# Patient Record
Sex: Male | Born: 1970 | Race: Black or African American | Hispanic: No | Marital: Single | Smoking: Current every day smoker
Health system: Southern US, Community
[De-identification: ages and names within clinical notes are randomized; demographics above are authoritative.]

## PROBLEM LIST (undated history)

## (undated) DIAGNOSIS — B20 Human immunodeficiency virus [HIV] disease: Secondary | ICD-10-CM

---

## 2007-02-23 ENCOUNTER — Emergency Department (HOSPITAL_COMMUNITY): Admission: EM | Admit: 2007-02-23 | Discharge: 2007-02-23 | Payer: Self-pay | Admitting: Emergency Medicine

## 2008-04-07 DIAGNOSIS — B20 Human immunodeficiency virus [HIV] disease: Secondary | ICD-10-CM

## 2008-04-07 DIAGNOSIS — Z21 Asymptomatic human immunodeficiency virus [HIV] infection status: Secondary | ICD-10-CM

## 2008-04-07 HISTORY — DX: Asymptomatic human immunodeficiency virus (hiv) infection status: Z21

## 2008-04-07 HISTORY — DX: Human immunodeficiency virus (HIV) disease: B20
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
## 2010-11-01 ENCOUNTER — Emergency Department (HOSPITAL_COMMUNITY)
Admission: EM | Admit: 2010-11-01 | Discharge: 2010-11-01 | Disposition: A | Discharge status: Home / Self Care | Payer: Managed Care, Other (non HMO) | Attending: Emergency Medicine | Admitting: Emergency Medicine

## 2010-11-01 DIAGNOSIS — R112 Nausea with vomiting, unspecified: Secondary | ICD-10-CM | POA: Insufficient documentation

## 2010-11-01 DIAGNOSIS — R109 Unspecified abdominal pain: Secondary | ICD-10-CM | POA: Insufficient documentation

## 2010-11-01 DIAGNOSIS — R079 Chest pain, unspecified: Secondary | ICD-10-CM | POA: Insufficient documentation

## 2010-11-01 DIAGNOSIS — F17200 Nicotine dependence, unspecified, uncomplicated: Secondary | ICD-10-CM | POA: Insufficient documentation

## 2010-11-01 DIAGNOSIS — R197 Diarrhea, unspecified: Secondary | ICD-10-CM | POA: Insufficient documentation

## 2010-11-01 LAB — CBC
HCT: 52.1 % — ABNORMAL HIGH (ref 39.0–52.0)
Hemoglobin: 18.9 g/dL — ABNORMAL HIGH (ref 13.0–17.0)
MCV: 84.4 fL (ref 78.0–100.0)
RBC: 6.17 MIL/uL — ABNORMAL HIGH (ref 4.22–5.81)
RDW: 12.4 % (ref 11.5–15.5)
WBC: 10.8 10*3/uL — ABNORMAL HIGH (ref 4.0–10.5)

## 2010-11-01 LAB — DIFFERENTIAL
Basophils Absolute: 0.1 10*3/uL (ref 0.0–0.1)
Eosinophils Absolute: 0.2 10*3/uL (ref 0.0–0.7)
Neutro Abs: 5.9 10*3/uL (ref 1.7–7.7)
Neutrophils Relative %: 54 % (ref 43–77)
Smear Review: ADEQUATE

## 2010-11-01 LAB — COMPREHENSIVE METABOLIC PANEL
AST: 28 U/L (ref 0–37)
BUN: 22 mg/dL (ref 6–23)
CO2: 20 mEq/L (ref 19–32)
Creatinine, Ser: 1.34 mg/dL (ref 0.50–1.35)
GFR calc non Af Amer: 60 mL/min — ABNORMAL LOW (ref >60)
Glucose, Bld: 103 mg/dL — ABNORMAL HIGH (ref 70–99)
Total Bilirubin: 0.4 mg/dL (ref 0.3–1.2)
Total Protein: 8.3 g/dL (ref 6.0–8.3)

## 2010-11-01 LAB — URINALYSIS, ROUTINE W REFLEX MICROSCOPIC
Hgb urine dipstick: NEGATIVE
Ketones, ur: 15 mg/dL — AB
Leukocytes, UA: NEGATIVE
Nitrite: NEGATIVE
Specific Gravity, Urine: 1.027 (ref 1.005–1.030)

## 2010-11-01 LAB — URINE MICROSCOPIC-ADD ON

## 2010-12-17 ENCOUNTER — Emergency Department (HOSPITAL_COMMUNITY)
Admission: EM | Admit: 2010-12-17 | Discharge: 2010-12-17 | Disposition: A | Discharge status: Home / Self Care | Payer: Managed Care, Other (non HMO) | Attending: Emergency Medicine | Admitting: Emergency Medicine

## 2010-12-17 DIAGNOSIS — R221 Localized swelling, mass and lump, neck: Secondary | ICD-10-CM | POA: Insufficient documentation

## 2010-12-17 DIAGNOSIS — K089 Disorder of teeth and supporting structures, unspecified: Secondary | ICD-10-CM | POA: Insufficient documentation

## 2010-12-17 DIAGNOSIS — R220 Localized swelling, mass and lump, head: Secondary | ICD-10-CM | POA: Insufficient documentation

## 2015-06-26 ENCOUNTER — Emergency Department (HOSPITAL_COMMUNITY)
Admission: EM | Admit: 2015-06-26 | Discharge: 2015-06-27 | Disposition: A | Discharge status: Home / Self Care | Payer: Managed Care, Other (non HMO) | Attending: Emergency Medicine | Admitting: Emergency Medicine

## 2015-06-26 ENCOUNTER — Encounter (HOSPITAL_COMMUNITY): Payer: Self-pay | Admitting: Emergency Medicine

## 2015-06-26 ENCOUNTER — Emergency Department (HOSPITAL_COMMUNITY): Payer: Managed Care, Other (non HMO)

## 2015-06-26 DIAGNOSIS — R1310 Dysphagia, unspecified: Secondary | ICD-10-CM | POA: Diagnosis not present

## 2015-06-26 DIAGNOSIS — J029 Acute pharyngitis, unspecified: Secondary | ICD-10-CM | POA: Diagnosis not present

## 2015-06-26 DIAGNOSIS — F17210 Nicotine dependence, cigarettes, uncomplicated: Secondary | ICD-10-CM | POA: Insufficient documentation

## 2015-06-26 DIAGNOSIS — R509 Fever, unspecified: Secondary | ICD-10-CM | POA: Diagnosis not present

## 2015-06-26 DIAGNOSIS — B20 Human immunodeficiency virus [HIV] disease: Secondary | ICD-10-CM | POA: Insufficient documentation

## 2015-06-26 DIAGNOSIS — E876 Hypokalemia: Secondary | ICD-10-CM | POA: Diagnosis not present

## 2015-06-26 DIAGNOSIS — R0981 Nasal congestion: Secondary | ICD-10-CM | POA: Diagnosis not present

## 2015-06-26 DIAGNOSIS — R630 Anorexia: Secondary | ICD-10-CM | POA: Diagnosis not present

## 2015-06-26 DIAGNOSIS — R05 Cough: Secondary | ICD-10-CM | POA: Insufficient documentation

## 2015-06-26 DIAGNOSIS — R0602 Shortness of breath: Secondary | ICD-10-CM | POA: Diagnosis present

## 2015-06-26 DIAGNOSIS — B370 Candidal stomatitis: Secondary | ICD-10-CM

## 2015-06-26 DIAGNOSIS — B379 Candidiasis, unspecified: Secondary | ICD-10-CM | POA: Insufficient documentation

## 2015-06-26 DIAGNOSIS — Z79899 Other long term (current) drug therapy: Secondary | ICD-10-CM | POA: Diagnosis not present

## 2015-06-26 HISTORY — DX: Human immunodeficiency virus (HIV) disease: B20

## 2015-06-26 MED ORDER — SODIUM CHLORIDE 0.9 % IV BOLUS (SEPSIS)
1000.0000 mL | Freq: Once | INTRAVENOUS | Status: AC
Start: 2015-06-26 — End: 2015-06-27
  Administered 2015-06-26: 1000 mL via INTRAVENOUS

## 2015-06-26 MED ORDER — ALBUTEROL SULFATE (2.5 MG/3ML) 0.083% IN NEBU
5.0000 mg | INHALATION_SOLUTION | Freq: Once | RESPIRATORY_TRACT | Status: AC
  Administered 2015-06-26: 5 mg via RESPIRATORY_TRACT
  Filled 2015-06-26: qty 6

## 2015-06-26 NOTE — ED Provider Notes (Signed)
CSN: [id number]     Arrival date & time 06/26/15  1636 History   First MD Initiated Contact with Patient 06/26/15 2225     Chief Complaint  Patient presents with  . Shortness of Breath  . Gastroesophageal Reflux     (Consider location/radiation/quality/duration/timing/severity/associated sxs/prior Treatment) Patient is a 45 y.o. male presenting with shortness of breath and GERD. The history is provided by the patient and medical records. No language interpreter was used.  Shortness of Breath Associated symptoms: cough, fever and sore throat   Associated symptoms: no abdominal pain, no chest pain, no headaches, no rash and no vomiting   Gastroesophageal Reflux Associated symptoms include congestion, coughing, a fever and a sore throat. Pertinent negatives include no abdominal pain, chest pain, chills, headaches, rash or vomiting.  Lucas Wood is a 45 y.o. male  with a PMH of HIV who presents to the Emergency Department complaining of worsening shortness of breath, productive cough, and "throat pain" x 2 weeks. Patient admits to difficulty swallowing and decreased PO intake 2/2 throat pain. Subjective fever at home. No medications taken PTA. No alleviating factors noted. Patient states he was last seen for his HIV 3 years ago- not currently on medication and not followed by primary physician.   Past Medical History  Diagnosis Date  . HIV (human immunodeficiency virus infection) (HCC)    History reviewed. No pertinent past surgical history. No family history on file. Social History  Substance Use Topics  . Smoking status: Current Every Day Smoker -- 1.00 packs/day    Types: Cigarettes  . Smokeless tobacco: None  . Alcohol Use: Yes    Review of Systems  Constitutional: Positive for fever and appetite change. Negative for chills.  HENT: Positive for congestion, sore throat and trouble swallowing.   Eyes: Negative for visual disturbance.  Respiratory: Positive for cough and  shortness of breath.   Cardiovascular: Negative for chest pain.  Gastrointestinal: Negative for vomiting, abdominal pain and diarrhea.  Genitourinary: Negative for dysuria.  Musculoskeletal: Negative for back pain.  Skin: Negative for rash.  Neurological: Negative for dizziness and headaches.      Allergies  Review of patient's allergies indicates no known allergies.  Home Medications   Prior to Admission medications   Medication Sig Start Date End Date Taking? Authorizing Provider  Alum Hydroxide-Mag Carbonate (GAVISCON EXTRA RELIEF FORMULA PO) Take 2-4 tablets by mouth every 6 (six) hours as needed (acid reflux).   Yes Historical Provider, MD  ibuprofen (ADVIL,MOTRIN) 200 MG tablet Take 800 mg by mouth every 6 (six) hours as needed for moderate pain.   Yes Historical Provider, MD  Pseudoephedrine-APAP-DM (DAYQUIL PO) Take 30 mLs by mouth daily as needed (cold).   Yes Historical Provider, MD  nystatin (MYCOSTATIN) 100000 UNIT/ML suspension Take 5 mLs (500,000 Units total) by mouth 4 (four) times daily. Swish in mouth several minutes and then swallow 06/27/15   [facility] LP[facility]ime Pilcher Ward, PA-C   BP 112/86 mmHg  Pulse 86  Temp(Src) 99 F (37.2 C) (Oral)  Resp 18  Ht 5\' 11"  (1.803 m)  Wt 58.514 kg  BMI 18.00 kg/m2  SpO2 99% Physical Exam  Constitutional: He is oriented to person, place, and time. He appears well-developed and well-nourished.  Alert and in no acute distress  HENT:  Head: Normocephalic and atraumatic.  Airway patent. Thrush to OP/hard palate. Tacky mucus membranes.   Cardiovascular: Normal rate, regular rhythm and normal heart sounds.  Exam reveals no gallop and no friction rub.  No murmur heard. Pulmonary/Chest: Effort normal and breath sounds normal. No respiratory distress. He has no wheezes. He has no rales. He exhibits no tenderness.  Abdominal: Soft. Bowel sounds are normal. He exhibits no distension and no mass. There is no tenderness. There is no rebound and no  guarding.  Musculoskeletal: He exhibits no edema.  Neurological: He is alert and oriented to person, place, and time.  Skin: Skin is warm and dry.  Cap refill less than 3 seconds.   Psychiatric: He has a normal mood and affect. His behavior is normal. Judgment and thought content normal.  Nursing note and vitals reviewed.   ED Course  Procedures (including critical care time) Labs Review Labs Reviewed  CBC WITH DIFFERENTIAL/PLATELET - Abnormal; Notable for the following:    WBC 3.9 (*)    RBC 3.52 (*)    Hemoglobin 10.8 (*)    HCT 31.3 (*)    Platelets 44 (*)    All other components within normal limits  BASIC METABOLIC PANEL - Abnormal; Notable for the following:    Potassium 2.9 (*)    Glucose, Bld 101 (*)    Calcium 8.0 (*)    All other components within normal limits  T-HELPER CELLS (CD4) COUNT (NOT AT [facility])  HIV 1 RNA QUANT-NO REFLEX-BLD    Imaging Review Dg Chest 2 View  06/26/2015  CLINICAL DATA:  Cough and shortness of breath for 2 days, patient smokes EXAM: CHEST  2 VIEW COMPARISON:  None. FINDINGS: The heart size and mediastinal contours are within normal limits. Both lungs are clear. The visualized skeletal structures are unremarkable. IMPRESSION: No active cardiopulmonary disease. Electronically Signed   By: Esperanza Heir M.D.   On: 06/26/2015 18:17   I have personally reviewed and evaluated these images and lab results as part of my medical decision-making.   EKG Interpretation   Date/Time:  Tuesday June 26 2015 17:19:41 EDT Ventricular Rate:  103 PR Interval:  149 QRS Duration: 103 QT Interval:  353 QTC Calculation: 462 R Axis:   89 Text Interpretation:  Sinus tachycardia Anteroseptal infarct, age  indeterminate Confirmed by Juleen China  MD, STEPHEN ([id number]) on 06/27/2015 1:32:42  AM      MDM   Final diagnoses:  Thrush  Hypokalemia   Lucas Wood is a 45 y.o. male with PMH of HIV who presents with cough/congestion/sore throat x 2 weeks. Patient  with thrush on exam. Patient has not been seen by physician in 3 years and not currently on any HIV medications.   Labs: CBC, BMP reviewed. K+ of  2.9 - replenished in ED. CD4 and viral load ordered to aide in follow up care.  Imaging: CXR with no acute cardiopulmonary disease Therapeutics: 1L IV fluids  Plan: Tx thrush with nystatin. Referral to ID and [facility]. Significant time was taken to stress the importance of follow up appointments. Home care instructions discussed. Return precautions discussed. All questions answered.   Patient seen by and discussed with Dr. Juleen China who agrees with treatment plan.    [facility] Ward, PA-C 06/27/15 0133  Raeford Razor, MD 07/03/15 1000

## 2015-06-26 NOTE — ED Provider Notes (Signed)
Medical screening examination/treatment/procedure(s) were conducted as a shared visit with non-physician practitioner(s) and myself.  I personally evaluated the patient during the encounter.   EKG Interpretation None     44yM with sore throat. Has thrush. Hx of HIV not on treatment. Reports that previous seen at [facility] but not in a couple years because of transportation difficulties. C/o cough and dyspnea but no distress on exam. No increased WOB. Lungs clear. CXR w/o acute abnormality. Afebrile. Nystatin for thrush. More pressing concern is that he gets established with ID. Will give contact information to him as well as [facility].   Raeford RazorStephen Rosalynd Mcwright, MD 06/28/15 [phone]

## 2015-06-26 NOTE — ED Notes (Signed)
Patient states he has been short of breath, productive cough (yellow mucus), acid reflux have also gotten his "esophagus raw" x2 weeks. Patient reports nausea without vomiting.

## 2015-06-27 LAB — CBC WITH DIFFERENTIAL/PLATELET
Basophils Absolute: 0 10*3/uL (ref 0.0–0.1)
Basophils Relative: 0 %
EOS ABS: 0 10*3/uL (ref 0.0–0.7)
EOS PCT: 0 %
HCT: 31.3 % — ABNORMAL LOW (ref 39.0–52.0)
Hemoglobin: 10.8 g/dL — ABNORMAL LOW (ref 13.0–17.0)
LYMPHS ABS: 1.1 10*3/uL (ref 0.7–4.0)
Lymphocytes Relative: 27 %
MCH: 30.7 pg (ref 26.0–34.0)
MCHC: 34.5 g/dL (ref 30.0–36.0)
MCV: 88.9 fL (ref 78.0–100.0)
MONO ABS: 0.5 10*3/uL (ref 0.1–1.0)
Monocytes Relative: 14 %
NEUTROS PCT: 59 %
Neutro Abs: 2.3 10*3/uL (ref 1.7–7.7)
PLATELETS: 44 10*3/uL — AB (ref 150–400)
RBC: 3.52 MIL/uL — AB (ref 4.22–5.81)
RDW: 12.5 % (ref 11.5–15.5)
WBC: 3.9 10*3/uL — AB (ref 4.0–10.5)

## 2015-06-27 LAB — BASIC METABOLIC PANEL
ANION GAP: 9 (ref 5–15)
BUN: 12 mg/dL (ref 6–20)
CALCIUM: 8 mg/dL — AB (ref 8.9–10.3)
CO2: 26 mmol/L (ref 22–32)
Chloride: 105 mmol/L (ref 101–111)
Creatinine, Ser: 0.79 mg/dL (ref 0.61–1.24)
GFR calc Af Amer: >60 mL/min (ref >60)
GLUCOSE: 101 mg/dL — AB (ref 65–99)
Potassium: 2.9 mmol/L — ABNORMAL LOW (ref 3.5–5.1)
SODIUM: 140 mmol/L (ref 135–145)

## 2015-06-27 LAB — T-HELPER CELLS (CD4) COUNT (NOT AT ARMC)
CD4 T CELL ABS: 110 /uL — AB (ref 400–2700)
CD4 T CELL HELPER: 10 % — AB (ref 33–55)

## 2015-06-27 MED ORDER — POTASSIUM CHLORIDE CRYS ER 20 MEQ PO TBCR
60.0000 meq | EXTENDED_RELEASE_TABLET | Freq: Once | ORAL | Status: AC
  Administered 2015-06-27: 60 meq via ORAL
  Filled 2015-06-27: qty 3

## 2015-06-27 MED ORDER — NYSTATIN 100000 UNIT/ML MT SUSP: 500000.0000 [IU] | Freq: Four times a day (QID) | OROMUCOSAL | Status: DC

## 2015-06-27 MED ORDER — POTASSIUM CHLORIDE CRYS ER 20 MEQ PO TBCR
40.0000 meq | EXTENDED_RELEASE_TABLET | Freq: Once | ORAL | Status: DC
  Filled 2015-06-27: qty 2

## 2015-06-27 NOTE — Discharge Instructions (Signed)
1. Medications: Use nystatin as directed, continue usual home medications 2. Treatment: rest, drink plenty of fluids 3. Follow Up: Please follow up with the infectious disease clinic listed above as well as the [facility] for discussion of your diagnoses and further evaluation after today's visit; Please return to the ER for new or worsening symptoms, any additional concerns.

## 2015-06-28 LAB — HIV-1 RNA QUANT-NO REFLEX-BLD
HIV 1 RNA QUANT: 3870000 {copies}/mL
LOG10 HIV-1 RNA: 6.588 log10copy/mL

## 2015-07-16 ENCOUNTER — Encounter (HOSPITAL_COMMUNITY): Payer: Self-pay | Admitting: *Deleted

## 2015-07-16 ENCOUNTER — Emergency Department (HOSPITAL_COMMUNITY)
Admission: EM | Admit: 2015-07-16 | Discharge: 2015-07-17 | Disposition: A | Discharge status: Home / Self Care | Payer: Managed Care, Other (non HMO) | Attending: Emergency Medicine | Admitting: Emergency Medicine

## 2015-07-16 ENCOUNTER — Emergency Department (HOSPITAL_COMMUNITY): Payer: Managed Care, Other (non HMO)

## 2015-07-16 DIAGNOSIS — J029 Acute pharyngitis, unspecified: Secondary | ICD-10-CM | POA: Diagnosis not present

## 2015-07-16 DIAGNOSIS — B20 Human immunodeficiency virus [HIV] disease: Secondary | ICD-10-CM | POA: Diagnosis not present

## 2015-07-16 DIAGNOSIS — R05 Cough: Secondary | ICD-10-CM | POA: Insufficient documentation

## 2015-07-16 DIAGNOSIS — Z79899 Other long term (current) drug therapy: Secondary | ICD-10-CM | POA: Diagnosis not present

## 2015-07-16 DIAGNOSIS — R000 Tachycardia, unspecified: Secondary | ICD-10-CM | POA: Insufficient documentation

## 2015-07-16 DIAGNOSIS — R059 Cough, unspecified: Secondary | ICD-10-CM

## 2015-07-16 DIAGNOSIS — F17210 Nicotine dependence, cigarettes, uncomplicated: Secondary | ICD-10-CM | POA: Diagnosis not present

## 2015-07-16 DIAGNOSIS — R0602 Shortness of breath: Secondary | ICD-10-CM | POA: Diagnosis not present

## 2015-07-16 DIAGNOSIS — B370 Candidal stomatitis: Secondary | ICD-10-CM | POA: Diagnosis not present

## 2015-07-16 DIAGNOSIS — Z21 Asymptomatic human immunodeficiency virus [HIV] infection status: Secondary | ICD-10-CM

## 2015-07-16 LAB — CBC WITH DIFFERENTIAL/PLATELET
BASOS PCT: 0 %
Basophils Absolute: 0 10*3/uL (ref 0.0–0.1)
EOS ABS: 0 10*3/uL (ref 0.0–0.7)
EOS PCT: 0 %
HCT: 29.4 % — ABNORMAL LOW (ref 39.0–52.0)
Hemoglobin: 10 g/dL — ABNORMAL LOW (ref 13.0–17.0)
Lymphocytes Relative: 29 %
Lymphs Abs: 0.8 10*3/uL (ref 0.7–4.0)
MCH: 30.3 pg (ref 26.0–34.0)
MCHC: 34 g/dL (ref 30.0–36.0)
MCV: 89.1 fL (ref 78.0–100.0)
MONOS PCT: 17 %
Monocytes Absolute: 0.5 10*3/uL (ref 0.1–1.0)
Neutro Abs: 1.5 10*3/uL — ABNORMAL LOW (ref 1.7–7.7)
Neutrophils Relative %: 55 %
PLATELETS: 70 10*3/uL — AB (ref 150–400)
RBC: 3.3 MIL/uL — ABNORMAL LOW (ref 4.22–5.81)
RDW: 12.4 % (ref 11.5–15.5)
WBC: 2.8 10*3/uL — ABNORMAL LOW (ref 4.0–10.5)

## 2015-07-16 LAB — BASIC METABOLIC PANEL
Anion gap: 12 (ref 5–15)
CO2: 28 mmol/L (ref 22–32)
Calcium: 8.6 mg/dL — ABNORMAL LOW (ref 8.9–10.3)
Chloride: 99 mmol/L — ABNORMAL LOW (ref 101–111)
Creatinine, Ser: 0.62 mg/dL (ref 0.61–1.24)
GFR calc Af Amer: >60 mL/min (ref >60)
GFR calc non Af Amer: >60 mL/min (ref >60)
Glucose, Bld: 90 mg/dL (ref 65–99)
Potassium: 3.2 mmol/L — ABNORMAL LOW (ref 3.5–5.1)
SODIUM: 139 mmol/L (ref 135–145)

## 2015-07-16 LAB — I-STAT CG4 LACTIC ACID, ED
LACTIC ACID, VENOUS: 0.96 mmol/L (ref 0.5–2.0)
Lactic Acid, Venous: 0.65 mmol/L (ref 0.5–2.0)

## 2015-07-16 MED ORDER — IPRATROPIUM-ALBUTEROL 0.5-2.5 (3) MG/3ML IN SOLN
3.0000 mL | Freq: Once | RESPIRATORY_TRACT | Status: AC
  Administered 2015-07-16: 3 mL via RESPIRATORY_TRACT
  Filled 2015-07-16: qty 3

## 2015-07-16 MED ORDER — AZITHROMYCIN 250 MG PO TABS: 250.0000 mg | ORAL_TABLET | Freq: Every day | ORAL | Status: DC

## 2015-07-16 MED ORDER — EMTRICITABINE-TENOFOVIR DF 200-300 MG PO TABS: 1.0000 | ORAL_TABLET | Freq: Every day | ORAL | Status: DC

## 2015-07-16 MED ORDER — ALBUTEROL SULFATE HFA 108 (90 BASE) MCG/ACT IN AERS
2.0000 | INHALATION_SPRAY | Freq: Once | RESPIRATORY_TRACT | Status: AC
  Administered 2015-07-17: 2 via RESPIRATORY_TRACT
  Filled 2015-07-16: qty 6.7

## 2015-07-16 MED ORDER — BENZONATATE 100 MG PO CAPS
100.0000 mg | ORAL_CAPSULE | Freq: Three times a day (TID) | ORAL | Status: AC
Start: 2015-07-16 — End: ?

## 2015-07-16 MED ORDER — NYSTATIN 100000 UNIT/ML MT SUSP: 500000.0000 [IU] | Freq: Four times a day (QID) | OROMUCOSAL | Status: AC

## 2015-07-16 MED ORDER — ALBUTEROL SULFATE HFA 108 (90 BASE) MCG/ACT IN AERS: 2.0000 | INHALATION_SPRAY | RESPIRATORY_TRACT | Status: AC | PRN

## 2015-07-16 MED ORDER — AZITHROMYCIN 250 MG PO TABS
500.0000 mg | ORAL_TABLET | Freq: Once | ORAL | Status: AC
  Administered 2015-07-17: 500 mg via ORAL
  Filled 2015-07-16: qty 2

## 2015-07-16 MED ORDER — BENZONATATE 100 MG PO CAPS
100.0000 mg | ORAL_CAPSULE | Freq: Once | ORAL | Status: AC
  Administered 2015-07-16: 100 mg via ORAL
  Filled 2015-07-16: qty 1

## 2015-07-16 NOTE — ED Notes (Signed)
Pt reports a productive cough for several weeks with yellow sputum. Reports cough is causing sob. Denies fever. [facility]

## 2015-07-16 NOTE — ED Provider Notes (Signed)
CSN: [id number]     Arrival date & time 07/16/15  1713 History   First MD Initiated Contact with Patient 07/16/15 2202     Chief Complaint  Patient presents with  . Cough  . Shortness of Breath     (Consider location/radiation/quality/duration/timing/severity/associated sxs/prior Treatment) HPI   Lucas Wood is a 45 y.o. male, with a history of uncontrolled HIV, presenting to the ED with productive cough with yellow or white sputum, shortness of breath, and sore throat for over a month. Pt has tried NyQuil and DayQuil for his symptoms with minimal relief. States he was last seen for his HIV around three years ago. Pt denies taking any antiviral therapy. Patient denies fever/chills, nausea/vomiting, chest pain, or any other complaints.    Past Medical History  Diagnosis Date  . HIV (human immunodeficiency virus infection) (HCC)    History reviewed. No pertinent past surgical history. History reviewed. No pertinent family history. Social History  Substance Use Topics  . Smoking status: Current Every Day Smoker -- 1.00 packs/day    Types: Cigarettes  . Smokeless tobacco: None  . Alcohol Use: Yes    Review of Systems  Constitutional: Negative for fever, chills, diaphoresis and fatigue.  HENT: Positive for sore throat.   Respiratory: Positive for cough and shortness of breath.   Cardiovascular: Negative for chest pain.  Gastrointestinal: Negative for nausea, vomiting and abdominal pain.  Neurological: Negative for dizziness, weakness, light-headedness and headaches.  All other systems reviewed and are negative.     Allergies  Review of patient's allergies indicates no known allergies.  Home Medications   Prior to Admission medications   Medication Sig Start Date End Date Taking? Authorizing Provider  DM-Doxylamine-Acetaminophen (NYQUIL COLD & FLU PO) Take 30 mLs by mouth at bedtime as needed (for cold).   Yes Historical Provider, MD  ibuprofen (ADVIL,MOTRIN) 200 MG  tablet Take 800 mg by mouth every 6 (six) hours as needed for moderate pain.   Yes Historical Provider, MD  Pseudoephedrine-APAP-DM (DAYQUIL PO) Take 30 mLs by mouth daily as needed (cold).   Yes Historical Provider, MD  albuterol (PROVENTIL HFA;VENTOLIN HFA) 108 (90 Base) MCG/ACT inhaler Inhale 2 puffs into the lungs every 4 (four) hours as needed for wheezing or shortness of breath. 07/16/15   Sidnee Gambrill C Kathlyne Loud, PA-C  azithromycin (ZITHROMAX Z-PAK) 250 MG tablet Take 1 tablet (250 mg total) by mouth daily. 07/16/15   Ellin Fitzgibbons C Chauntae Hults, PA-C  benzonatate (TESSALON) 100 MG capsule Take 1 capsule (100 mg total) by mouth every 8 (eight) hours. 07/16/15   Jadan Hinojos C Dewayne Jurek, PA-C  emtricitabine-tenofovir (TRUVADA) 200-300 MG tablet Take 1 tablet by mouth daily. 07/16/15   Yoshiharu Brassell C Kathren Scearce, PA-C  nystatin (MYCOSTATIN) 100000 UNIT/ML suspension Take 5 mLs (500,000 Units total) by mouth 4 (four) times daily. 07/16/15   Lenora Gomes C Elida Harbin, PA-C   BP 106/73 mmHg  Pulse 101  Temp(Src) 98.7 F (37.1 C) (Oral)  Resp 16  SpO2 95% Physical Exam  Constitutional: He appears well-developed and well-nourished. No distress.  HENT:  Head: Normocephalic and atraumatic.  Oral thrush noted to mucous membranes  Eyes: Conjunctivae are normal. Pupils are equal, round, and reactive to light.  Neck: Normal range of motion. Neck supple.  Cardiovascular: Normal rate, regular rhythm, normal heart sounds and intact distal pulses.   Pulmonary/Chest: Effort normal. No respiratory distress. He has wheezes in the right upper field, the right middle field, the right lower field, the left upper field, the left middle field  and the left lower field.  No increased work of breathing. Patient speaks in full sentences without difficulty.  Abdominal: Soft. There is no tenderness. There is no guarding.  Musculoskeletal: He exhibits no edema or tenderness.  Lymphadenopathy:    He has no cervical adenopathy.  Neurological: He is alert.  Skin: Skin is warm and dry. He  is not diaphoretic.  Psychiatric: He has a normal mood and affect. His behavior is normal.  Nursing note and vitals reviewed.   ED Course  Procedures (including critical care time) Labs Review Labs Reviewed  CBC WITH DIFFERENTIAL/PLATELET - Abnormal; Notable for the following:    WBC 2.8 (*)    RBC 3.30 (*)    Hemoglobin 10.0 (*)    HCT 29.4 (*)    Platelets 70 (*)    Neutro Abs 1.5 (*)    All other components within normal limits  BASIC METABOLIC PANEL - Abnormal; Notable for the following:    Potassium 3.2 (*)    Chloride 99 (*)    BUN <5 (*)    Calcium 8.6 (*)    All other components within normal limits  T-HELPER CELLS (CD4) COUNT (NOT AT [facility])  HIV-1 RNA ULTRAQUANT REFLEX TO GENTYP+  I-STAT CG4 LACTIC ACID, ED  I-STAT CG4 LACTIC ACID, ED    Imaging Review Dg Chest 2 View  07/16/2015  CLINICAL DATA:  Cough and congestion for 1 month, shortness of breath, dysphagia and sore throat at times, history HIV, smoking EXAM: CHEST  2 VIEW COMPARISON:  06/26/2015 FINDINGS: Normal heart size, mediastinal contours, and pulmonary vascularity. Mild chronic bronchitic changes. Lungs hyperinflated but otherwise clear. No pleural effusion or pneumothorax. No acute bony abnormalities. IMPRESSION: Chronic bronchitic changes and hyperinflation without acute infiltrate. Electronically Signed   By: Ulyses Southward M.D.   On: 07/16/2015 18:28   I have personally reviewed and evaluated these images and lab results as part of my medical decision-making.   EKG Interpretation None       Medications  azithromycin (ZITHROMAX) tablet 500 mg (not administered)  albuterol (PROVENTIL HFA;VENTOLIN HFA) 108 (90 Base) MCG/ACT inhaler 2 puff (not administered)  ipratropium-albuterol (DUONEB) 0.5-2.5 (3) MG/3ML nebulizer solution 3 mL (3 mLs Nebulization Given 07/16/15 2240)  benzonatate (TESSALON) capsule 100 mg (100 mg Oral Given 07/16/15 2240)    MDM   Final diagnoses:  Cough  Shortness of breath  HIV  (human immunodeficiency virus infection) (HCC)    Lucas Wood presents with cough, shortness of breath, and sore throat for at least 5 weeks.  Findings and plan of care discussed with Lyndal Pulley, MD. Dr. Clydene Pugh personally evaluated and examined this patient.  Patient is nontoxic appearing, afebrile, is only mildly tachycardic, not tachypneic, maintains SPO2 of 97-99% on room air, and is in no apparent distress. Patient has no signs of sepsis. IV fluids were discussed with the patient, but pt declined this treatment. Patient's pulse rate reduced during treatment here in the ED. I suspect that the reason the patient's pulse was above 100 at discharge was due to the effects of the DuoNeb. This patient has been noncompliant with HIV treatment for a number of years. Patient states that he previously has received treatment from infectious disease through [facility], however, there are no records that come up in a query of [facility], [address], [place], or any infectious disease clinic in the state. Patient was educated on HIV, AIDS, and the dangers of refusing to comply with antiviral therapy. Patient  will need to make sure to follow up with the infectious disease clinic as soon as possible. Patient states that he will do this tomorrow morning. Patient cannot remember what antiviral therapy he was taking 3 years ago. Patient to be discharged with Truvada and nystatin oral mouthwash, in addition to the medications for his cough. Case management consult placed to assist patient with getting his medications. Due to the lateness of patient's visit, his case will be placed in the queue to be picked up by a Child psychotherapistsocial worker tomorrow morning.  Upon reassessment, patient states that his shortness of breath has improved. Home care and return precautions discussed. Patient voiced understanding of these instructions, accepts the plan, and is comfortable with discharge.   Filed Vitals:    07/16/15 1721 07/16/15 2004  BP: 95/80 101/68  Pulse: 110 104  Temp: 99.4 F (37.4 C) 98.7 F (37.1 C)  TempSrc: Oral Oral  Resp: 18 16  SpO2: 97% 99%   Filed Vitals:   07/16/15 2345 07/17/15 0015 07/17/15 0030 07/17/15 0045  BP: 103/61 103/65 103/72 106/73  Pulse: 102 102 106 101  Temp:      TempSrc:      Resp:    16  SpO2: 91% 92% 97% 95%      Anselm PancoastShawn C Louella Medaglia, PA-C 07/17/15 0054  Lyndal Pulleyaniel Knott, MD 07/17/15 [phone]

## 2015-07-17 ENCOUNTER — Telehealth: Payer: Self-pay | Admitting: Surgery

## 2015-07-17 NOTE — Discharge Instructions (Signed)
You have been seen today for cough, shortness of breath, and sore throat. 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 It is also quite possible that your HIV is progressing to AIDS. It is imperative that you seek treatment from an infectious disease clinic. A referral to the [facility] is included in this paperwork. Call tomorrow morning to get an appointment. A social worker from [facility] should also call you sometime tomorrow to speak with you about assistance with getting your medications. As for the treatment of your symptoms, drink plenty of fluids and get plenty of rest. You should be drinking at least a liter of water an hour to stay hydrated. Ibuprofen or Tylenol for pain or fever. Tessalon for cough. Plain Mucinex may help relieve congestion. Follow up with PCP as needed. Return to ED should symptoms worsen.  RESOURCE GUIDE  Chronic Pain Problems: Contact [facility]  [phone] Patients need to be referred by their primary care doctor.  Insufficient Money for Medicine: Contact United Way:  call "211" or [facility] [phone].  No Primary Care Doctor: - Call Health Connect  [phone] - can help you locate a primary care doctor that  accepts your insurance, provides certain services, etc. - Physician Referral Service- [phone]  Agencies that provide inexpensive medical care: - [facility]  [phone] - [facility]  [phone] - [facility]  [phone] - Women's Clinic  [phone] - Planned Parenthood  [phone] Haynes Bast- [facility]  [phone]  Medicaid-accepting [facility] Providers: - [facility]- [address], [address]  [phone], Mon-Fri 9am-7pm, Sat 9am-1pm - [facility]- [address], [address] [place]  [phone] - [facility]- [address], [address] [place]  [phone] [facility]- [address]  [phone] - Renaye RakersVeita Bland- [address], [address], [phone]  Only accepts [place] patients after they have their name  applied to their card  Self Pay (no insurance) in [place]: - Sickle Cell Patients: Dr Willey BladeEric Dean, [facility]  [address], [phone] - [facility]- [address]  [phone]       Redge Gainer-     [facility]- [address], [address]       -     [facility]- see information above (Speak to Cit[employer]am H if you do not have insurance)       -  [facility]- [address], [phone]       -  [facility]- [address],  [phone]       -  [facility]- [address] [place] [address], [phone]       -  Dr Julio Sickssei-Bonsu-  [address], [address], [place], [phone]       -  [facility]- [address], [phone]       -  [facility] [place]- [address] [place] [address], [phone], also [address], [phone]       -    [facility]- [address], [phone], 1st & 3rd Saturday   every month, 10am-1pm  1) Find a Doctor and Pay Out of Pocket Although you won't have to find out who is covered by your  insurance plan, it is a good idea to ask around and get recommendations. You will then need to call the office and see if the doctor you have chosen will accept you as a new patient and what types of options they offer for patients who are self-pay. Some doctors offer discounts or will set up payment plans for their patients who do not have insurance, but you will need to ask so you aren't surprised when you get to your appointment.  2) Contact Your Local Health Department Not all health departments have doctors that can see patients for sick visits, but many do, so it is worth a call to see if yours does. If you don't know where your local health department is, you can check in your phone book. The CDC  also has a tool to help you locate your state's health department, and many state websites also have listings of all of their local health departments.  3) Find a Walk-in Clinic If your illness is not likely to be very severe or complicated, you may want to try a walk in clinic. These are popping up all over the country in pharmacies, drugstores, and shopping centers. They're usually staffed by nurse practitioners or physician assistants that have been trained to treat common illnesses and complaints. They're usually fairly quick and inexpensive. However, if you have serious medical issues or chronic medical problems, these are probably not your best option  STD Testing - [facility] Department of [facility] [place], STD Clinic, [address], [place], phone [phone] or [phone].  Monday - Friday, call for an appointment. [facility] Department of [employer], STD Clinic, [address], [place], phone [phone] or [phone].  Monday - Friday, call for an appointment.  Abuse/Neglect: [facility] Child Abuse Hotline 224-358-7336 [facility] Child Abuse Hotline 5180986718 (After Hours)  Emergency Shelter:  [facility] [phone]  Maternity Homes: - [facility] [phone])[phone] - [facility] ([phone]  MRSA Hotline #:   760-724-8347  [facility] Resources  [facility] [place]  United [facility] Dept. [address].                 [address]         [address]  Blondell Reveal Phone:  [phone]                                  Phone:  [phone]                   Phone:  [phone]  [facility], [phone] - [phone]       -     [facility]  in [place], [address]  [phone], [place] [phone] or [phone] (After Hours)   [place]  Substance Abuse Resources: - Alcohol and Drug Services  [phone] - [place] 5644911062 - [facility] [place] [phone] - Residential & Outpatient Substance Abuse Program  [phone])177-2928  Psychological Services: - [place], [place] [address], [place], [place]: [phone] or [phone], PicCapture[employer]uy  Dental Assistance  If unable to pay or uninsured, contact:  [facility] or [facility]. to become qualified for the adult dental clinic.  Patients with Medicaid: [facility] [phone] [address], [place] [address] [phone]  If unable to pay, or uninsured, contact [facility] [phone]) or [place] [phone] in [place], [place] in [facility]) to become qualified for the adult dental clinic   Other [place]- [place], [place], [place], [address], [place], [place], 2nd and 4th Thursday of the month at 6:30am.  10 clients each day by appointment, can sometimes see walk-in patients if someone does not show for an appointment. [facility]- [address] [place], [place], [address], [place], [place], [place], [address], [place] Department- [place] Department- [place] Department- [phone]

## 2015-07-17 NOTE — Telephone Encounter (Signed)
ED CM attempted to contact patient at the number in record, no answer and unable to LVM, CM will make 2nd attempt 4/12.

## 2015-07-18 LAB — T-HELPER CELLS (CD4) COUNT (NOT AT ARMC)
CD4 % Helper T Cell: 9 % — ABNORMAL LOW (ref 33–55)
CD4 T Cell Abs: 50 /uL — ABNORMAL LOW (ref 400–2700)

## 2015-07-27 LAB — HIV-1 RNA ULTRAQUANT REFLEX TO GENTYP+
HIV-1 RNA BY PCR: 2610000 {copies}/mL
HIV-1 RNA Quant, Log: 6.417 log10copy/mL

## 2015-07-27 LAB — REFLEX TO GENOSURE(R) MG: HIV GENOSURE(R) MG PDF: 0

## 2015-08-19 ENCOUNTER — Encounter (HOSPITAL_COMMUNITY): Payer: Self-pay

## 2015-08-19 ENCOUNTER — Emergency Department (HOSPITAL_COMMUNITY): Payer: Managed Care, Other (non HMO)

## 2015-08-19 ENCOUNTER — Emergency Department (HOSPITAL_COMMUNITY)
Admission: EM | Admit: 2015-08-19 | Discharge: 2015-08-19 | Disposition: A | Discharge status: Home / Self Care | Payer: Managed Care, Other (non HMO) | Attending: Emergency Medicine | Admitting: Emergency Medicine

## 2015-08-19 DIAGNOSIS — F17210 Nicotine dependence, cigarettes, uncomplicated: Secondary | ICD-10-CM | POA: Diagnosis not present

## 2015-08-19 DIAGNOSIS — Z7951 Long term (current) use of inhaled steroids: Secondary | ICD-10-CM | POA: Diagnosis not present

## 2015-08-19 DIAGNOSIS — Z791 Long term (current) use of non-steroidal anti-inflammatories (NSAID): Secondary | ICD-10-CM | POA: Insufficient documentation

## 2015-08-19 DIAGNOSIS — K209 Esophagitis, unspecified without bleeding: Secondary | ICD-10-CM

## 2015-08-19 DIAGNOSIS — Z79899 Other long term (current) drug therapy: Secondary | ICD-10-CM | POA: Insufficient documentation

## 2015-08-19 DIAGNOSIS — B20 Human immunodeficiency virus [HIV] disease: Secondary | ICD-10-CM | POA: Diagnosis not present

## 2015-08-19 DIAGNOSIS — Z792 Long term (current) use of antibiotics: Secondary | ICD-10-CM | POA: Insufficient documentation

## 2015-08-19 DIAGNOSIS — R0789 Other chest pain: Secondary | ICD-10-CM | POA: Diagnosis present

## 2015-08-19 LAB — BASIC METABOLIC PANEL
Anion gap: 13 (ref 5–15)
BUN: 14 mg/dL (ref 6–20)
CHLORIDE: 101 mmol/L (ref 101–111)
CO2: 26 mmol/L (ref 22–32)
CREATININE: 0.76 mg/dL (ref 0.61–1.24)
Calcium: 9.2 mg/dL (ref 8.9–10.3)
GFR calc Af Amer: >60 mL/min (ref >60)
GFR calc non Af Amer: >60 mL/min (ref >60)
GLUCOSE: 97 mg/dL (ref 65–99)
Potassium: 2.9 mmol/L — ABNORMAL LOW (ref 3.5–5.1)
SODIUM: 140 mmol/L (ref 135–145)

## 2015-08-19 LAB — CBC
HCT: 32.7 % — ABNORMAL LOW (ref 39.0–52.0)
Hemoglobin: 10.9 g/dL — ABNORMAL LOW (ref 13.0–17.0)
MCH: 29.8 pg (ref 26.0–34.0)
MCHC: 33.3 g/dL (ref 30.0–36.0)
MCV: 89.3 fL (ref 78.0–100.0)
PLATELETS: 96 10*3/uL — AB (ref 150–400)
RBC: 3.66 MIL/uL — ABNORMAL LOW (ref 4.22–5.81)
RDW: 14.7 % (ref 11.5–15.5)
WBC: 2.9 10*3/uL — ABNORMAL LOW (ref 4.0–10.5)

## 2015-08-19 LAB — I-STAT TROPONIN, ED: Troponin i, poc: 0 ng/mL (ref 0.00–0.08)

## 2015-08-19 MED ORDER — SUCRALFATE 1 GM/10ML PO SUSP: 1.0000 g | Freq: Three times a day (TID) | ORAL | Status: AC

## 2015-08-19 MED ORDER — FLUCONAZOLE 100 MG PO TABS: 100.0000 mg | ORAL_TABLET | Freq: Every day | ORAL | Status: DC

## 2015-08-19 MED ORDER — SUCRALFATE 1 GM/10ML PO SUSP
1.0000 g | Freq: Three times a day (TID) | ORAL | Status: DC
  Administered 2015-08-19: 1 g via ORAL
  Filled 2015-08-19 (×5): qty 10

## 2015-08-19 MED ORDER — PANTOPRAZOLE SODIUM 20 MG PO TBEC
20.0000 mg | DELAYED_RELEASE_TABLET | Freq: Every day | ORAL | Status: DC
  Administered 2015-08-19: 20 mg via ORAL
  Filled 2015-08-19 (×2): qty 1

## 2015-08-19 MED ORDER — SODIUM CHLORIDE 0.9 % IV BOLUS (SEPSIS)
1000.0000 mL | Freq: Once | INTRAVENOUS | Status: AC
  Administered 2015-08-19: 1000 mL via INTRAVENOUS

## 2015-08-19 MED ORDER — SODIUM CHLORIDE 0.9 % IV SOLN
INTRAVENOUS | Status: DC
  Administered 2015-08-19: 13:00:00 via INTRAVENOUS

## 2015-08-19 MED ORDER — PANTOPRAZOLE SODIUM 20 MG PO TBEC: 20.0000 mg | DELAYED_RELEASE_TABLET | Freq: Every day | ORAL | Status: AC

## 2015-08-19 MED ORDER — FLUCONAZOLE 100 MG PO TABS
100.0000 mg | ORAL_TABLET | Freq: Once | ORAL | Status: AC
  Administered 2015-08-19: 100 mg via ORAL
  Filled 2015-08-19 (×2): qty 1

## 2015-08-19 NOTE — ED Notes (Signed)
Patient reports intermittent chest pain and SOB x 3 days ago. Patient states he has intermittent dizziness and the last being 2 days ago which caused him to fall.

## 2015-08-19 NOTE — ED Notes (Signed)
Patient too dizzy to complete standing part of orthostatic vital signs.

## 2015-08-19 NOTE — ED Notes (Signed)
Per CM Helmut Musterlicia, pt has insurance, and was advised to call his insurance to help him find a PCP within his network.

## 2015-08-19 NOTE — Care Management Note (Addendum)
Case Management Note  Patient Details  Name: Lucas Wood MRN: [id number] Date of Birth: Dec 18, 1970  Subjective/Objective:     HIV, unable to swallow               Action/Plan: Discharge Planning:  Pt states he is having difficulty with swallowing and when he eats it hurts. States he works at [employer] full-time. Encouraged pt to discuss with his employer about getting his [name] started. Explained to pt he will need a PCP to assist with paperwork. Provided pt with contact number for Sickle Cell Primary (overflow clinic for [facility], no appts available). Pt states he could get by financially on his short term disability until he get to feeling better. He has friend's that are good support to him. He lives at home alone. He plans to [facility] tomorrow to assist him with HIV and medication dosing. States he has always followed up in ED and has not seen a PCP or specialist. Provided pt with NCM contact number.   Expected Discharge Date:   08/19/2015            Expected Discharge Plan:  Home/Self Care  In-House Referral:  NA  Discharge planning Services  CM Consult  Post Acute Care Choice:  NA Choice offered to:  NA  DME Arranged:  N/A DME Agency:  NA  HH Arranged:  NA HH Agency:  NA  Status of Service:  Completed, signed off  Medicare Important Message Given:    Date Medicare IM Given:    Medicare IM give by:    Date Additional Medicare IM Given:    Additional Medicare Important Message give by:     If discussed at Long Length of Stay Meetings, dates discussed:    Additional Comments:  Lucas Wood, Lucas Mclees Ellen, RN 08/19/2015, 12:00 PM

## 2015-08-19 NOTE — Discharge Instructions (Signed)
Locating a Primary Care Physician  Please contact [employer] toll free number on your card to locate a Primary Care Physician. Rosann Auerbach will mail you a provider list and give you names of physicians in your area.     Esophagitis Esophagitis is inflammation of the esophagus. The esophagus is the tube that carries food and liquids from your mouth to your stomach. Esophagitis can cause soreness or pain in the esophagus. This condition can make it difficult and painful to swallow.  CAUSES Most causes of esophagitis are not serious. Common causes of this condition include:  Gastroesophageal reflux disease (GERD). This is when stomach contents move back up into the esophagus (reflux).  Repeated vomiting.  An allergic-type reaction, especially caused by food allergies (eosinophilic esophagitis).  Injury to the esophagus by swallowing large pills with or without water, or swallowing certain types of medicines.  Swallowing (ingesting) harmful chemicals, such as household cleaning products.  Heavy alcohol use.  An infection of the esophagus.This most often occurs in people who have a weakened immune system.  Radiation or chemotherapy treatment for cancer.  Certain diseases such as sarcoidosis, Crohn disease, and scleroderma. SYMPTOMS Symptoms of this condition include:  Difficult or painful swallowing.  Pain with swallowing acidic liquids, such as citrus juices.  Pain with burping.  Chest pain.  Difficulty breathing.  Nausea.  Vomiting.  Pain in the abdomen.  Weight loss.  Ulcers in the mouth.  Patches of white material in the mouth (candidiasis).  Fever.  Coughing up blood or vomiting blood.  Stool that is black, tarry, or bright red. DIAGNOSIS Your health care provider will take a medical history and perform a physical exam. You may also have other tests, including:  An endoscopy to examine your stomach and esophagus with a small camera.  A test that measures the  acidity level in your esophagus.  A test that measures how much pressure is on your esophagus.  A barium swallow or modified barium swallow to show the shape, size, and functioning of your esophagus.  Allergy tests. TREATMENT Treatment for this condition depends on the cause of your esophagitis. In some cases, steroids or other medicines may be given to help relieve your symptoms or to treat the underlying cause of your condition. You may have to make some lifestyle changes, such as:  Avoiding alcohol.  Quitting smoking.  Changing your diet.  Exercising.  Changing your sleep habits and your sleep environment. HOME CARE INSTRUCTIONS Take these actions to decrease your discomfort and to help avoid complications. Diet  Follow a diet as recommended by your health care provider. This may involve avoiding foods and drinks such as:  Coffee and tea (with or without caffeine).  Drinks that contain alcohol.  Energy drinks and sports drinks.  Carbonated drinks or sodas.  Chocolate and cocoa.  Peppermint and mint flavorings.  Garlic and onions.  Horseradish.  Spicy and acidic foods, including peppers, chili powder, curry powder, vinegar, hot sauces, and barbecue sauce.  Citrus fruit juices and citrus fruits, such as oranges, lemons, and limes.  Tomato-based foods, such as red sauce, chili, salsa, and pizza with red sauce.  Fried and fatty foods, such as donuts, french fries, potato chips, and high-fat dressings.  High-fat meats, such as hot dogs and fatty cuts of red and white meats, such as rib eye steak, sausage, ham, and bacon.  High-fat dairy items, such as whole milk, butter, and cream cheese.  Eat small, frequent meals instead of large meals.  Avoid drinking  large amounts of liquid with your meals.  Avoid eating meals during the 2-3 hours before bedtime.  Avoid lying down right after you eat.  Do not exercise right after you eat.  Avoid foods and drinks that  seem to make your symptoms worse. General Instructions  Pay attention to any changes in your symptoms.  Take over-the-counter and prescription medicines only as told by your health care provider. Do not take aspirin, ibuprofen, or other NSAIDs unless your health care provider told you to do so.  If you have trouble taking pills, use a pill splitter to decrease the size of the pill. This will decrease the chance of the pill getting stuck or injuring your esophagus on the way down. Also, drink water after you take a pill.  Do not use any tobacco products, including cigarettes, chewing tobacco, and e-cigarettes. If you need help quitting, ask your health care provider.  Wear loose-fitting clothing. Do not wear anything tight around your waist that causes pressure on your abdomen.  Raise (elevate) the head of your bed about 6 inches (15 cm).  Try to reduce your stress, such as with yoga or meditation. If you need help reducing stress, ask your health care provider.  If you are overweight, reduce your weight to an amount that is healthy for you. Ask your health care provider for guidance about a safe weight loss goal.  Keep all follow-up visits as told by your health care provider. This is important. SEEK MEDICAL CARE IF:  You have new symptoms.  You have unexplained weight loss.  You have difficulty swallowing, or it hurts to swallow.  You have wheezing or a persistent cough.  Your symptoms do not improve with treatment.  You have frequent heartburn for more than two weeks. SEEK IMMEDIATE MEDICAL CARE IF:  You have severe pain in your arms, neck, jaw, teeth, or back.  You feel sweaty, dizzy, or light-headed.  You have chest pain or shortness of breath.  You vomit and your vomit looks like blood or coffee grounds.  Your stool is bloody or black.  You have a fever.  You cannot swallow, drink, or eat.   This information is not intended to replace advice given to you by your  health care provider. Make sure you discuss any questions you have with your health care provider.   Document Released: 05/01/2004 Document Revised: 12/13/2014 Document Reviewed: 07/19/2014 Elsevier Interactive Patient Education 2016 Elsevier Inc.  HIV Infection and AIDS HIV (human immunodeficiency virus) infection is a permanent (chronic) viral infection. HIV kills white blood cells that are called CD4 cells. These cells help to control your body's defense system (immune system) and fight infection. If you do not have enough CD4 cells, you can develop infections, cancers, and other health problems. If it is not treated, HIV infection advances through three phases:  Asymptomatic phase.  Early symptomatic phase.  Symptomatic phase (also known as AIDS, or acquired immunodeficiency syndrome). CAUSES HIV infection is caused by the human immunodeficiency virus. This virus is passed from one person to another person through sex, through contact with infected blood, or during childbirth or breastfeeding. RISK FACTORS  Having unprotected sex.  Sharing needles or other drug equipment. SYMPTOMS Asymptomatic Phase You may not feel sick, or you may only feel sick some of the time. Many people do not know that they have HIV in this phase. Symptoms may include:  Low-grade fever.  Rash.  Fatigue.  Sore throat.  Headaches.  Nausea, vomiting, or  diarrhea.  Night sweats. Early Symptomatic Phase You may notice:  Your early symptoms getting worse or happening more often.  Oral, vaginal, or rectal sores that are caused by infections.  Problems that are related to inflammation, such as joint pain. Symptomatic Phase (AIDS) Your immune system no longer protects you from infections and other health problems. You may get opportunistic diseases, which are infections or conditions that you would not normally get if your immune system was healthy and working properly. Problems that are caused by  opportunistic diseases include:  Coughing.  Trouble breathing.  Diarrhea.  Skin sores.  Trouble swallowing.  High fevers.  Blurred vision.  Stiff neck.  Mental confusion. You may also begin to notice:  Weight loss.  Tingling or pain in your hands and feet.  Mouth sores or tooth pain.  Severe fatigue. DIAGNOSIS Your health care provider will do a screening test that looks for a chemical in your body that is produced only when it is trying to fight HIV. HIV is confirmed with another blood test. TREATMENT There is no cure for HIV infection, but there are treatments that can keep HIV from getting worse. You will be given medicines called antiretroviral therapy (ART) based on your lab tests, your medical history, past treatments for HIV, and your other health problems. ART will:  Keep your immune system as healthy as possible and help it work better.  Decrease the amount of HIV in your body.  Reduce the risk of problems caused by HIV.  Prolong your life.  Improve the quality of your life.  Help prevent passing HIV to someone else. You will need to take ART for the rest of your life. You will need to have routine lab tests performed to monitor your treatment and immune system. HOME CARE INSTRUCTIONS  See your health care provider and have your blood tested every 3-6 months to monitor your health and to make sure your treatment is working.  Take your medicines every day as directed by your health care provider.  Stop or decrease your use of alcohol, tobacco, and recreational drugs, which can cause further damage to your immune system. They can also cause problems with your liver, lungs, and heart.  Protect yourself from other sexual infections by using condoms when you have sex.  Protect yourself from other blood infections by using your own equipment if you inject, smoke, or snort drugs. Do not share equipment.  Tell your sexual partner(s) that you have HIV.  Encourage them to get tested.  Keep your vaccinations up to date. Make sure that you get all recommended vaccines, including vaccines for hepatitis A, hepatitis B, measles, and influenza.  Eat in a healthy way, exercise, and get enough sleep.  See your dentist regularly. Brush and floss you teeth every day.  See a counselor or a Child psychotherapistsocial worker to help you solve problems and find any services that you need.  Get support from your family and friends. PREVENTION To prevent the spread of HIV:  Talk with your health care provider about protecting your sexual partner(s) from HIV. Your health care provider may encourage your partner(s) to take HIV medicines to decrease the risk of getting HIV. These medicines are called pre-exposure prophylaxis (PrEP).  Use a condom every time you have sexual intercourse. This includes vaginal, oral, and anal sexual activity.  The condom should be in place from the beginning to the end of sexual activity.  Use only latex or polyurethane condoms and water-based lubricants.  Wearing a condom reduces, but does not completely eliminate, your risk of spreading HIV.  Condoms also protect you from other STDs (sexually transmitted diseases).  Avoid alcohol and recreational drugs that affect your judgment. They may make you forget to use a condom or increase your chances of participating in high-risk sex.  Do not share equipment that you use to take drugs, such as needles, syringes, cookers, tourniquets, pipes, or straws. If you share equipment, clean it before and after you use it. SEEK MEDICAL CARE IF:  You lose a lot of weight.  You have extreme fatigue.  You have trouble swallowing.  You have vomiting or diarrhea that does not get better.  You have muscle pain or joint pain.  You have any problems that are related to your medicines. SEEK IMMEDIATE MEDICAL CARE IF:  You have a rash that causes your skin to peel.  You develop blisters inside your  mouth.  You have pain in your abdomen.  You have swelling around your eyes or you have eye redness.  You have a high fever and chills.  You have shortness of breath or a cough that is dry (nonproductive) or wet (productive).  You have vision problems, such as blind spots, flashing lights, or decreased or blurred vision.  You have a persistent headache, confusion, or changes in the way that you think or see things (altered mental status).   This information is not intended to replace advice given to you by your health care provider. Make sure you discuss any questions you have with your health care provider.   Document Released: 01/06/2014 Document Revised: 08/08/2014 Document Reviewed: 01/06/2014 Elsevier Interactive Patient Education [employer]

## 2015-08-19 NOTE — ED Provider Notes (Signed)
CSN: [id number]     Arrival date & time 08/19/15  4098 History   First MD Initiated Contact with Patient 08/19/15 1003     Chief Complaint  Patient presents with  . Shortness of Breath  . Chest Pain     (Consider location/radiation/quality/duration/timing/severity/associated sxs/prior Treatment) HPI   Lucas Wood is a 45 y.o. male who presents for evaluation of swallowing disorder, present for several months, and is characterized by able to initiate swallowing, but then feels like food catches in his mid chest. Because of this, he has had decreased oral intake. He has chest discomfort only when attempting to swallow. He has ongoing shortness of breath with cough, productive of yellow sputum. He was seen and treated for a cough, about one month ago, at that time was treated with an oral antibiotic, Tessalon, oral nystatin, for thrush, and restarted on his antiviral medications, with a one-month prescription of Truvada. He does not have a primary care doctor or infectious disease doctor at this time. He was previously noncompliant with treatment for HIV disease. He denies fever, headache, blurred vision, vomiting or diarrhea. There are no other known modifying factors.   Past Medical History  Diagnosis Date  . HIV (human immunodeficiency virus infection) (HCC)    History reviewed. No pertinent past surgical history. Family History  Problem Relation Age of Onset  . Cancer Father    Social History  Substance Use Topics  . Smoking status: Current Every Day Smoker -- 1.00 packs/day    Types: Cigarettes  . Smokeless tobacco: Never Used  . Alcohol Use: No    Review of Systems  All other systems reviewed and are negative.     Allergies  Review of patient's allergies indicates no known allergies.  Home Medications   Prior to Admission medications   Medication Sig Start Date End Date Taking? Authorizing Provider  albuterol (PROVENTIL HFA;VENTOLIN HFA) 108 (90 Base) MCG/ACT  inhaler Inhale 2 puffs into the lungs every 4 (four) hours as needed for wheezing or shortness of breath. 07/16/15  Yes Shawn C Joy, PA-C  emtricitabine-tenofovir (TRUVADA) 200-300 MG tablet Take 1 tablet by mouth daily. 07/16/15  Yes Shawn C Joy, PA-C  ibuprofen (ADVIL,MOTRIN) 200 MG tablet Take 800 mg by mouth every 6 (six) hours as needed for moderate pain.   Yes Historical Provider, MD  azithromycin (ZITHROMAX Z-PAK) 250 MG tablet Take 1 tablet (250 mg total) by mouth daily. Patient not taking: Reported on 08/19/2015 07/16/15   Shawn C Joy, PA-C  benzonatate (TESSALON) 100 MG capsule Take 1 capsule (100 mg total) by mouth every 8 (eight) hours. Patient not taking: Reported on 08/19/2015 07/16/15   Shawn C Joy, PA-C  fluconazole (DIFLUCAN) 100 MG tablet Take 1 tablet (100 mg total) by mouth daily. 08/19/15   Mancel Bale, MD  nystatin (MYCOSTATIN) 100000 UNIT/ML suspension Take 5 mLs (500,000 Units total) by mouth 4 (four) times daily. Patient not taking: Reported on 08/19/2015 07/16/15   Shawn C Joy, PA-C  pantoprazole (PROTONIX) 20 MG tablet Take 1 tablet (20 mg total) by mouth daily. 08/19/15   Mancel Bale, MD  sucralfate (CARAFATE) 1 GM/10ML suspension Take 10 mLs (1 g total) by mouth 4 (four) times daily -  with meals and at bedtime. 08/19/15   Mancel Bale, MD   BP 137/91 mmHg  Pulse 92  Temp(Src) 98.2 F (36.8 C) (Oral)  Resp 18  Ht  (1.803 m)  Wt 99 lb 3.2 oz (44.997 kg)  BMI 13.84  kg/m2  SpO2 100% Physical Exam  Constitutional: He is oriented to person, place, and time. He appears well-developed. No distress.  He appears underweight.  HENT:  Head: Normocephalic and atraumatic.  Right Ear: External ear normal.  Left Ear: External ear normal.  Eyes: Conjunctivae and EOM are normal. Pupils are equal, round, and reactive to light.  Neck: Normal range of motion and phonation normal. Neck supple.  Cardiovascular: Normal rate, regular rhythm and normal heart sounds.    Pulmonary/Chest: Effort normal and breath sounds normal. No respiratory distress. He has no wheezes. He exhibits no bony tenderness.  Abdominal: Soft. He exhibits no distension. There is no tenderness. There is no guarding.  Musculoskeletal: Normal range of motion. He exhibits no edema.  Neurological: He is alert and oriented to person, place, and time. No cranial nerve deficit or sensory deficit. He exhibits normal muscle tone. Coordination normal.  Skin: Skin is warm, dry and intact.  Psychiatric: He has a normal mood and affect. His behavior is normal. Judgment and thought content normal.  Nursing note and vitals reviewed.   ED Course  Procedures (including critical care time)  Initial clinical impression- swelling disorder, likely multifactorial related to HIV disease. Differential diagnosis includes GERD, fungal esophagitis, esophageal tumor, chest tumor, pulmonary infection, and upper abdominal disorders. Will evaluate with laboratory testing chest x-ray, treat symptomatically and reassess.  Medications  0.9 %  sodium chloride infusion ( Intravenous New Bag/Given 08/19/15 1236)  sucralfate (CARAFATE) 1 GM/10ML suspension 1 g (1 g Oral Given 08/19/15 1246)  pantoprazole (PROTONIX) EC tablet 20 mg (20 mg Oral Given 08/19/15 1200)  sodium chloride 0.9 % bolus 1,000 mL (1,000 mLs Intravenous New Bag/Given 08/19/15 1116)  fluconazole (DIFLUCAN) tablet 100 mg (100 mg Oral Given 08/19/15 1200)    Patient Vitals for the past 24 hrs:  BP Temp Temp src Pulse Resp SpO2 Height Weight  08/19/15 1230 137/91 mmHg - - 92 18 100 % - -  08/19/15 1200 130/91 mmHg - - 92 16 100 % - -  08/19/15 1130 119/89 mmHg - - 94 16 100 % - -  08/19/15 1116 - - - - - - 5\' 11"  (1.803 m) 99 lb 3.2 oz (44.997 kg)  08/19/15 1105 - - - - - - - 100 lb (45.36 kg)  08/19/15 1100 122/95 mmHg - - 107 21 97 % - -  08/19/15 0954 112/80 mmHg 98.2 F (36.8 C) Oral (!) 136 (!) 29 100 % - -    1:10 PM Reevaluation with update  and discussion. After initial assessment and treatment, an updated evaluation reveals He remains comfortable. He is talking to case management, who is helping to arrange him follow-up care. The case manager requests referral to GI, and ID. Findings discussed with patient, all questions answered. Kristyne Woodring L    Labs Review Labs Reviewed  BASIC METABOLIC PANEL - Abnormal; Notable for the following:    Potassium 2.9 (*)    All other components within normal limits  CBC - Abnormal; Notable for the following:    WBC 2.9 (*)    RBC 3.66 (*)    Hemoglobin 10.9 (*)    HCT 32.7 (*)    Platelets 96 (*)    All other components within normal limits  I-STAT TROPOININ, ED    Imaging Review Dg Chest 2 View  08/19/2015  CLINICAL DATA:  Shortness of breath for 2 days EXAM: CHEST  2 VIEW COMPARISON:  07/16/2015 FINDINGS: Cardiomediastinal silhouette is stable. No acute  infiltrate or pleural effusion. No pulmonary edema. Hyperinflation again noted. Bony thorax is unremarkable. IMPRESSION: No active cardiopulmonary disease.  Hyperinflation again noted. Electronically Signed   By: Natasha MeadLiviu  Pop M.D.   On: 08/19/2015 11:07   I have personally reviewed and evaluated these images and lab results as part of my medical decision-making.   EKG Interpretation   Date/Time:  Sunday Aug 19 2015 09:56:15 EDT Ventricular Rate:  130 PR Interval:  119 QRS Duration: 110 QT Interval:  332 QTC Calculation: 488 R Axis:   78 Text Interpretation:  Sinus tachycardia RSR' in V1 or V2, probably normal  variant Minimal ST depression, inferior leads Borderline prolonged QT  interval Since last tracing rate faster and inferior  ST depression is new  Confirmed by [facility] IncWENTZ  MD, Geniyah Eischeid ([id number]) on 08/19/2015 10:15:22 AM      MDM   Final diagnoses:  Esophagitis  HIV (human immunodeficiency virus infection) (HCC)    Evaluation is most consistent with esophagitis, unspecified. He has HIV disease the differential for this  problem is very wide. He has had significant weight loss. Patient is nontoxic, and can be treated as an outpatient. Doubt serious bacterial infection, metabolic instability or impending vascular collapse.  Nursing Notes Reviewed/ Care Coordinated Applicable Imaging Reviewed Interpretation of Laboratory Data incorporated into ED treatment  The patient appears reasonably screened and/or stabilized for discharge and I doubt any other medical condition or other [facility] requiring further screening, evaluation, or treatment in the ED at this time prior to discharge.  Plan: Home Medications- Carafate, Protonix, Diflucan- one-month prescriptions for each, prolonged treatment for HIV associated illness; Home Treatments- rest, fluids; return here if the recommended treatment, does not improve the symptoms; Recommended follow up- establish PCP follow-up ASAP. Referrals to GI and ID.     Mancel BaleElliott Dodger Sinning, MD 08/19/15 [id number]

## 2015-08-20 NOTE — Progress Notes (Signed)
Received call from pt and he made appt with Dr. Loreta AveMann, GI, and PCP, [facility]. Referral needed for [facility]. Contacted office and they faxed referral sheet. Faxed completed referral to office. Isidoro DonningAlesia Nayra Coury RN CCM Case Mgmt phone [phone]

## 2015-08-29 ENCOUNTER — Other Ambulatory Visit: Payer: Self-pay | Admitting: Infectious Disease

## 2015-08-29 ENCOUNTER — Telehealth: Payer: Self-pay

## 2015-08-29 NOTE — Telephone Encounter (Signed)
Message left for patient to call for lab appointment.   He will need new patient labs and office visit only per Dr Ninetta LightsHatcher.

## 2015-09-18 ENCOUNTER — Encounter: Payer: Self-pay | Admitting: Infectious Diseases

## 2015-09-18 ENCOUNTER — Ambulatory Visit (INDEPENDENT_AMBULATORY_CARE_PROVIDER_SITE_OTHER): Payer: Managed Care, Other (non HMO) | Admitting: Infectious Diseases

## 2015-09-18 ENCOUNTER — Telehealth: Payer: Self-pay

## 2015-09-18 VITALS — BP 112/77 | HR 101 | Temp 98.0°F | Wt 110.0 lb

## 2015-09-18 DIAGNOSIS — Z23 Encounter for immunization: Secondary | ICD-10-CM

## 2015-09-18 DIAGNOSIS — B20 Human immunodeficiency virus [HIV] disease: Secondary | ICD-10-CM

## 2015-09-18 DIAGNOSIS — Z79899 Other long term (current) drug therapy: Secondary | ICD-10-CM | POA: Diagnosis not present

## 2015-09-18 DIAGNOSIS — Z113 Encounter for screening for infections with a predominantly sexual mode of transmission: Secondary | ICD-10-CM

## 2015-09-18 LAB — CBC
HEMATOCRIT: 25.3 % — AB (ref 38.5–50.0)
HEMOGLOBIN: 8.3 g/dL — AB (ref 13.2–17.1)
MCH: 28.8 pg (ref 27.0–33.0)
MCHC: 32.8 g/dL (ref 32.0–36.0)
MCV: 87.8 fL (ref 80.0–100.0)
MPV: 10.2 fL (ref 7.5–12.5)
Platelets: 145 10*3/uL (ref 140–400)
RBC: 2.88 MIL/uL — AB (ref 4.20–5.80)
RDW: 15.6 % — AB (ref 11.0–15.0)
WBC: 2.3 10*3/uL — AB (ref 3.8–10.8)

## 2015-09-18 LAB — LIPID PANEL
Cholesterol: 113 mg/dL — ABNORMAL LOW (ref 125–200)
HDL: 28 mg/dL — ABNORMAL LOW (ref >=40)
LDL CALC: 42 mg/dL (ref <130)
TRIGLYCERIDES: 214 mg/dL — AB (ref <150)
Total CHOL/HDL Ratio: 4 Ratio (ref <=5.0)
VLDL: 43 mg/dL — AB (ref <30)

## 2015-09-18 LAB — COMPREHENSIVE METABOLIC PANEL
ALBUMIN: 2.9 g/dL — AB (ref 3.6–5.1)
ALK PHOS: 89 U/L (ref 40–115)
ALT: 40 U/L (ref 9–46)
AST: 63 U/L — AB (ref 10–40)
BILIRUBIN TOTAL: 0.3 mg/dL (ref 0.2–1.2)
BUN: 9 mg/dL (ref 7–25)
CALCIUM: 8.1 mg/dL — AB (ref 8.6–10.3)
CO2: 29 mmol/L (ref 20–31)
Chloride: 101 mmol/L (ref 98–110)
Creat: 0.64 mg/dL (ref 0.60–1.35)
GLUCOSE: 127 mg/dL — AB (ref 65–99)
Potassium: 2.6 mmol/L — CL (ref 3.5–5.3)
Sodium: 139 mmol/L (ref 135–146)
TOTAL PROTEIN: 6.6 g/dL (ref 6.1–8.1)

## 2015-09-18 MED ORDER — AZITHROMYCIN 600 MG PO TABS: 1200.0000 mg | ORAL_TABLET | ORAL | Status: DC

## 2015-09-18 MED ORDER — AZITHROMYCIN 600 MG PO TABS: 1200.0000 mg | ORAL_TABLET | ORAL | Status: AC

## 2015-09-18 MED ORDER — SULFAMETHOXAZOLE-TRIMETHOPRIM 400-80 MG PO TABS: 1.0000 | ORAL_TABLET | ORAL | Status: DC

## 2015-09-18 NOTE — Addendum Note (Signed)
Addended by: Rejeana BrockMURRAY, Sheryn Aldaz A on: 09/18/2015 05:11 PM   Modules accepted: Orders

## 2015-09-18 NOTE — Telephone Encounter (Signed)
Sent [name] papers per patient's request and fax number verification to Gae DryJackie Moore, Sr. HR Specialist for [employer]. Papers filled out by Dr. Ninetta LightsHatcher. Copies made. Originals given to patient. Fax returned OK status. Rejeana Brockandace Murray, LPN

## 2015-09-18 NOTE — Addendum Note (Signed)
Addended by: Mariea ClontsGREEN, Reginae Wolfrey D on: 09/18/2015 02:22 PM   Modules accepted: Orders

## 2015-09-18 NOTE — Progress Notes (Signed)
   Subjective:    Patient ID: Lucas Wood, male    DOB: [birth year], 45 y.o.   MRN: [id number]  HPI 45 yo M with hx of HIV+ since 2010. [facility]. He was seen in ED 08-2015 with dysphagia and thrush for ~ 2 months. He was given diflucan, carafate. This has since resolved.  States he has been on truvada only. States he has been on this since April.   HIV 1 RNA QUANT (copies/mL)  Date Value  06/27/2015 3870000   CD4 T CELL ABS (/uL)  Date Value  07/16/2015 50*  06/27/2015 110*    The past medical history, family history and social history were reviewed/updated in EPIC  Has condoms. No sex in 7 years.  Works in [employer], has plenty of food.    Review of Systems  Constitutional: Negative for appetite change and unexpected weight change.  HENT: Negative for mouth sores, sore throat and trouble swallowing.   Respiratory: Positive for shortness of breath. Negative for cough.   Gastrointestinal: Negative for diarrhea and constipation.  Genitourinary: Negative for difficulty urinating.  Neurological: Positive for dizziness. Negative for headaches.  wt up 11#. DOE     Objective:   Physical Exam  Constitutional: He appears cachectic.  HENT:  Mouth/Throat: No oropharyngeal exudate.  Eyes: EOM are normal. Pupils are equal, round, and reactive to light.  Neck: Neck supple.  Cardiovascular: Normal rate, regular rhythm and normal heart sounds.   Pulmonary/Chest: Effort normal and breath sounds normal. No respiratory distress.  Abdominal: Soft. Bowel sounds are normal. There is no tenderness. There is no rebound.  Musculoskeletal: He exhibits no edema.  Lymphadenopathy:    He has no cervical adenopathy.       Assessment & Plan:

## 2015-09-18 NOTE — Assessment & Plan Note (Signed)
Will start him on azithro, bactrim Will check genotype pnvx Quantiferon.  Stop truvada for now.  Offered/refused condoms.  rtc in 2 weeks.  [facility], counselors to him.  Dental

## 2015-09-19 ENCOUNTER — Other Ambulatory Visit: Payer: Self-pay | Admitting: Infectious Diseases

## 2015-09-19 ENCOUNTER — Telehealth: Payer: Self-pay | Admitting: *Deleted

## 2015-09-19 ENCOUNTER — Other Ambulatory Visit: Payer: Managed Care, Other (non HMO)

## 2015-09-19 DIAGNOSIS — E876 Hypokalemia: Secondary | ICD-10-CM

## 2015-09-19 LAB — HEPATITIS B SURFACE ANTIGEN: Hepatitis B Surface Ag: NEGATIVE

## 2015-09-19 LAB — HEPATITIS B SURFACE ANTIBODY,QUALITATIVE: Hep B S Ab: NEGATIVE

## 2015-09-19 LAB — T-HELPER CELL (CD4) - (RCID CLINIC ONLY)
CD4 % Helper T Cell: 5 % — ABNORMAL LOW (ref 33–55)
CD4 T Cell Abs: 30 /uL — ABNORMAL LOW (ref 400–2700)

## 2015-09-19 LAB — RPR

## 2015-09-19 LAB — HIV 1/2 CONFIRMATION
HIV-1 antibody: POSITIVE — AB
HIV-2 Ab: NEGATIVE

## 2015-09-19 LAB — HEPATITIS A ANTIBODY, TOTAL: Hep A Total Ab: NONREACTIVE

## 2015-09-19 LAB — HEPATITIS C ANTIBODY: HCV Ab: NEGATIVE

## 2015-09-19 LAB — HEPATITIS B CORE ANTIBODY, TOTAL: Hep B Core Total Ab: NONREACTIVE

## 2015-09-19 LAB — HIV ANTIBODY (ROUTINE TESTING W REFLEX): HIV 1&2 Ab, 4th Generation: REACTIVE — AB

## 2015-09-19 NOTE — Telephone Encounter (Signed)
Alert LOW Potassium from 09/18/15 lab work.  Lab work printed and given to Dr. Ninetta LightsHatcher in clinic this AM for review.  Please advise.

## 2015-09-19 NOTE — Telephone Encounter (Signed)
Patient notified and he will come in this AM for repeat BMP. Added to lab schedule. Wendall MolaJacqueline Cockerham

## 2015-09-19 NOTE — Telephone Encounter (Signed)
-----   Message from Ginnie SmartJeffrey C Hatcher, MD sent at 09/19/2015  9:00 AM EDT ----- Needs repeat potassium, asap,   ----- Message -----    From: Lab in [facility] Interface    Sent: 09/18/2015   9:38 PM      To: Ginnie SmartJeffrey C Hatcher, MD

## 2015-09-20 LAB — BASIC METABOLIC PANEL
BUN: 12 mg/dL (ref 7–25)
CHLORIDE: 98 mmol/L (ref 98–110)
CO2: 27 mmol/L (ref 20–31)
CREATININE: 0.73 mg/dL (ref 0.60–1.35)
Calcium: 8 mg/dL — ABNORMAL LOW (ref 8.6–10.3)
GLUCOSE: 90 mg/dL (ref 65–99)
Potassium: 2.6 mmol/L — CL (ref 3.5–5.3)
Sodium: 137 mmol/L (ref 135–146)

## 2015-09-20 LAB — HIV-1 RNA ULTRAQUANT REFLEX TO GENTYP+
HIV 1 RNA Quant: 654426 copies/mL — ABNORMAL HIGH (ref <20)
HIV-1 RNA Quant, Log: 5.82 Log copies/mL — ABNORMAL HIGH (ref <1.30)

## 2015-09-21 ENCOUNTER — Telehealth: Payer: Self-pay | Admitting: *Deleted

## 2015-09-21 ENCOUNTER — Encounter (HOSPITAL_COMMUNITY): Payer: Self-pay

## 2015-09-21 ENCOUNTER — Emergency Department (HOSPITAL_COMMUNITY)
Admission: EM | Admit: 2015-09-21 | Discharge: 2015-09-21 | Disposition: A | Discharge status: Home / Self Care | Payer: Managed Care, Other (non HMO) | Attending: Emergency Medicine | Admitting: Emergency Medicine

## 2015-09-21 ENCOUNTER — Telehealth: Payer: Self-pay | Admitting: Internal Medicine

## 2015-09-21 DIAGNOSIS — Z21 Asymptomatic human immunodeficiency virus [HIV] infection status: Secondary | ICD-10-CM | POA: Diagnosis not present

## 2015-09-21 DIAGNOSIS — D649 Anemia, unspecified: Secondary | ICD-10-CM | POA: Insufficient documentation

## 2015-09-21 DIAGNOSIS — F17210 Nicotine dependence, cigarettes, uncomplicated: Secondary | ICD-10-CM | POA: Insufficient documentation

## 2015-09-21 DIAGNOSIS — R799 Abnormal finding of blood chemistry, unspecified: Secondary | ICD-10-CM | POA: Diagnosis present

## 2015-09-21 DIAGNOSIS — E876 Hypokalemia: Secondary | ICD-10-CM | POA: Diagnosis not present

## 2015-09-21 DIAGNOSIS — B20 Human immunodeficiency virus [HIV] disease: Secondary | ICD-10-CM

## 2015-09-21 LAB — CBC WITH DIFFERENTIAL/PLATELET
BASOS ABS: 0 10*3/uL (ref 0.0–0.1)
Basophils Relative: 0 %
EOS PCT: 6 %
Eosinophils Absolute: 0.2 10*3/uL (ref 0.0–0.7)
HEMATOCRIT: 24.7 % — AB (ref 39.0–52.0)
HEMOGLOBIN: 8.1 g/dL — AB (ref 13.0–17.0)
Lymphocytes Relative: 28 %
Lymphs Abs: 0.9 10*3/uL (ref 0.7–4.0)
MCH: 28.2 pg (ref 26.0–34.0)
MCHC: 32.8 g/dL (ref 30.0–36.0)
MCV: 86.1 fL (ref 78.0–100.0)
MONO ABS: 0.6 10*3/uL (ref 0.1–1.0)
MONOS PCT: 17 %
NEUTROS PCT: 49 %
Neutro Abs: 1.6 10*3/uL — ABNORMAL LOW (ref 1.7–7.7)
Platelets: 132 10*3/uL — ABNORMAL LOW (ref 150–400)
RBC: 2.87 MIL/uL — AB (ref 4.22–5.81)
RDW: 14.6 % (ref 11.5–15.5)
WBC: 3.3 10*3/uL — AB (ref 4.0–10.5)

## 2015-09-21 LAB — QUANTIFERON TB GOLD ASSAY (BLOOD)
Mitogen-Nil: 0.46 IU/mL
QUANTIFERON NIL VALUE: 0.07 [IU]/mL
Quantiferon Tb Ag Minus Nil Value: 0.01 IU/mL

## 2015-09-21 LAB — BASIC METABOLIC PANEL
ANION GAP: 9 (ref 5–15)
BUN: 8 mg/dL (ref 6–20)
CHLORIDE: 102 mmol/L (ref 101–111)
CO2: 26 mmol/L (ref 22–32)
Calcium: 8.6 mg/dL — ABNORMAL LOW (ref 8.9–10.3)
Creatinine, Ser: 0.67 mg/dL (ref 0.61–1.24)
GFR calc non Af Amer: >60 mL/min (ref >60)
Glucose, Bld: 107 mg/dL — ABNORMAL HIGH (ref 65–99)
POTASSIUM: 2.3 mmol/L — AB (ref 3.5–5.1)
Sodium: 137 mmol/L (ref 135–145)

## 2015-09-21 LAB — MAGNESIUM: Magnesium: 1.7 mg/dL (ref 1.7–2.4)

## 2015-09-21 MED ORDER — MAGNESIUM SULFATE 2 GM/50ML IV SOLN
2.0000 g | Freq: Once | INTRAVENOUS | Status: AC
  Administered 2015-09-21: 2 g via INTRAVENOUS
  Filled 2015-09-21: qty 50

## 2015-09-21 MED ORDER — POTASSIUM CHLORIDE CRYS ER 20 MEQ PO TBCR
40.0000 meq | EXTENDED_RELEASE_TABLET | Freq: Two times a day (BID) | ORAL | Status: DC
  Administered 2015-09-21: 40 meq via ORAL
  Filled 2015-09-21: qty 2

## 2015-09-21 MED ORDER — POTASSIUM CHLORIDE CRYS ER 20 MEQ PO TBCR: 40.0000 meq | EXTENDED_RELEASE_TABLET | Freq: Two times a day (BID) | ORAL | Status: AC

## 2015-09-21 NOTE — ED Notes (Signed)
Per Pt, Pt is coming from PCP with low potassium. Pt went to PCP for a wellness check. Denies any other symptoms.

## 2015-09-21 NOTE — ED Provider Notes (Signed)
CSN: [id number]     Arrival date & time 09/21/15  1148 History   First MD Initiated Contact with Patient 09/21/15 1312     Chief Complaint  Patient presents with  . Abnormal Lab      HPI  Expand All Collapse All   Per Pt, Pt is coming from PCP with low potassium. Pt went to PCP for a wellness check. Denies any other symptoms.        Past Medical History  Diagnosis Date  . HIV (human immunodeficiency virus infection) (HCC) 2010   History reviewed. No pertinent past surgical history. Family History  Problem Relation Age of Onset  . Cancer Father     lung? Prostate?  . Stroke Cousin     vs MI? sudden death   Social History  Substance Use Topics  . Smoking status: Current Every Day Smoker -- 1.00 packs/day    Types: Cigarettes  . Smokeless tobacco: Never Used  . Alcohol Use: No    Review of Systems  Gastrointestinal: Positive for diarrhea.  All other systems reviewed and are negative.     Allergies  Review of patient's allergies indicates no known allergies.  Home Medications   Prior to Admission medications   Medication Sig Start Date End Date Taking? Authorizing Provider  albuterol (PROVENTIL HFA;VENTOLIN HFA) 108 (90 Base) MCG/ACT inhaler Inhale 2 puffs into the lungs every 4 (four) hours as needed for wheezing or shortness of breath. 07/16/15  Yes Shawn C Joy, PA-C  amoxicillin (AMOXIL) 500 MG capsule Take 500 mg by mouth 4 (four) times daily. 09/10/15  Yes Historical Provider, MD  azithromycin (ZITHROMAX) 600 MG tablet Take 2 tablets (1,200 mg total) by mouth once a week. 09/18/15  Yes Ginnie Smart, MD  ibuprofen (ADVIL,MOTRIN) 200 MG tablet Take 800 mg by mouth every 6 (six) hours as needed for moderate pain. Reported on 09/18/2015   Yes Historical Provider, MD  sulfamethoxazole-trimethoprim (BACTRIM) 400-80 MG tablet Take 1 tablet by mouth 3 (three) times a week. 09/18/15  Yes Ginnie Smart, MD  benzonatate (TESSALON) 100 MG capsule Take 1 capsule (100 mg  total) by mouth every 8 (eight) hours. Patient not taking: Reported on 08/19/2015 07/16/15   Shawn C Joy, PA-C  fluconazole (DIFLUCAN) 100 MG tablet Take 1 tablet (100 mg total) by mouth daily. Patient not taking: Reported on 09/18/2015 08/19/15   Mancel Bale, MD  nystatin (MYCOSTATIN) 100000 UNIT/ML suspension Take 5 mLs (500,000 Units total) by mouth 4 (four) times daily. Patient not taking: Reported on 08/19/2015 07/16/15   Shawn C Joy, PA-C  pantoprazole (PROTONIX) 20 MG tablet Take 1 tablet (20 mg total) by mouth daily. Patient not taking: Reported on 09/18/2015 08/19/15   Mancel Bale, MD  potassium chloride SA (K-DUR,KLOR-CON) 20 MEQ tablet Take 2 tablets (40 mEq total) by mouth 2 (two) times daily. 09/21/15   Nelva Nay, MD  sucralfate (CARAFATE) 1 GM/10ML suspension Take 10 mLs (1 g total) by mouth 4 (four) times daily -  with meals and at bedtime. Patient not taking: Reported on 09/18/2015 08/19/15   Mancel Bale, MD   BP 94/67 mmHg  Pulse 81  Temp(Src) 98.4 F (36.9 C) (Oral)  Resp 23  SpO2 100% Physical Exam Physical Exam  Nursing note and vitals reviewed. Constitutional: He is oriented to person, place, and time. He appears thin. No distress.  HENT:  Head: Normocephalic and atraumatic.  Eyes: Pupils are equal, round, and reactive to light.  Neck: Normal range  of motion.  Cardiovascular: Normal rate and intact distal pulses.   Pulmonary/Chest: No respiratory distress.  Abdominal: Normal appearance. He exhibits no distension.  Musculoskeletal: Normal range of motion.  Neurological: He is alert and oriented to person, place, and time. No cranial nerve deficit.  Skin: Skin is warm and dry. No rash noted.  Psychiatric: He has a normal mood and affect. His behavior is normal.   ED Course  Procedures (including critical care time) Medications  magnesium sulfate IVPB 2 g 50 mL (0 g Intravenous Stopped 09/21/15 1537)    Medications  magnesium sulfate IVPB 2 g 50 mL (0 g  Intravenous Stopped 09/21/15 1537)    Labs Review Labs Reviewed  CBC WITH DIFFERENTIAL/PLATELET - Abnormal; Notable for the following:    WBC 3.3 (*)    RBC 2.87 (*)    Hemoglobin 8.1 (*)    HCT 24.7 (*)    Platelets 132 (*)    Neutro Abs 1.6 (*)    All other components within normal limits  BASIC METABOLIC PANEL - Abnormal; Notable for the following:    Potassium 2.3 (*)    Glucose, Bld 107 (*)    Calcium 8.6 (*)    All other components within normal limits  MAGNESIUM    Imaging Review No results found. I have personally reviewed and evaluated these images and lab results as part of my medical decision-making.   EKG Interpretation   Date/Time:  Friday September 21 2015 11:55:49 EDT Ventricular Rate:  99 PR Interval:  134 QRS Duration: 106 QT Interval:  368 QTC Calculation: 472 R Axis:   81 Text Interpretation:  Normal sinus rhythm Incomplete right bundle branch  block Nonspecific ST and T wave abnormality Abnormal ECG Confirmed by  Orvil Faraone  MD, Miquela Costabile ([id number]) on 09/21/2015 1:14:25 PM      MDM   Final diagnoses:  Hypokalemia  Anemia, unspecified anemia type  HIV (human immunodeficiency virus infection) (HCC)        Nelva Nayobert Valla Pacey, MD 09/29/15 [phone]

## 2015-09-21 NOTE — Discharge Instructions (Signed)
Potassium Content of Foods Potassium is a mineral found in many foods and drinks. It helps keep fluids and minerals balanced in your body and affects how steadily your heart beats. Potassium also helps control your blood pressure and keep your muscles and nervous system healthy. Certain health conditions and medicines may change the balance of potassium in your body. When this happens, you can help balance your level of potassium through the foods that you do or do not eat. Your health care provider or dietitian may recommend an amount of potassium that you should have each day. The following lists of foods provide the amount of potassium (in parentheses) per serving in each item. HIGH IN POTASSIUM  The following foods and beverages have 200 mg or more of potassium per serving:  Apricots, 2 raw or 5 dry (200 mg).  Artichoke, 1 medium (345 mg).  Avocado, raw,  each (245 mg).  Banana, 1 medium (425 mg).  Beans, lima, or baked beans, canned,  cup (280 mg).  Beans, white, canned,  cup (595 mg).  Beef roast, 3 oz (320 mg).  Beef, ground, 3 oz (270 mg).  Beets, raw or cooked,  cup (260 mg).  Bran muffin, 2 oz (300 mg).  Broccoli,  cup (230 mg).  Brussels sprouts,  cup (250 mg).  Cantaloupe,  cup (215 mg).  Cereal, 100% bran,  cup (200-400 mg).  Cheeseburger, single, fast food, 1 each (225-400 mg).  Chicken, 3 oz (220 mg).  Clams, canned, 3 oz (535 mg).  Crab, 3 oz (225 mg).  Dates, 5 each (270 mg).  Dried beans and peas,  cup (300-475 mg).  Figs, dried, 2 each (260 mg).  Fish: halibut, tuna, cod, snapper, 3 oz (480 mg).  Fish: salmon, haddock, swordfish, perch, 3 oz (300 mg).  Fish, tuna, canned 3 oz (200 mg).  [place] fries, fast food, 3 oz (470 mg).  Granola with fruit and nuts,  cup (200 mg).  Grapefruit juice,  cup (200 mg).  Greens, beet,  cup (655 mg).  Honeydew melon,  cup (200 mg).  Kale, raw, 1 cup (300 mg).  Kiwi, 1 medium (240  mg).  Kohlrabi, rutabaga, parsnips,  cup (280 mg).  Lentils,  cup (365 mg).  Mango, 1 each (325 mg).  Milk, chocolate, 1 cup (420 mg).  Milk: nonfat, low-fat, whole, buttermilk, 1 cup (350-380 mg).  Molasses, 1 Tbsp (295 mg).  Mushrooms,  cup (280) mg.  Nectarine, 1 each (275 mg).  Nuts: almonds, peanuts, hazelnuts, [place], cashew, mixed, 1 oz (200 mg).  Nuts, pistachios, 1 oz (295 mg).  Orange, 1 each (240 mg).  Orange juice,  cup (235 mg).  Papaya, medium,  fruit (390 mg).  Peanut butter, chunky, 2 Tbsp (240 mg).  Peanut butter, smooth, 2 Tbsp (210 mg).  Pear, 1 medium (200 mg).  Pomegranate, 1 whole (400 mg).  Pomegranate juice,  cup (215 mg).  Pork, 3 oz (350 mg).  Potato chips, salted, 1 oz (465 mg).  Potato, baked with skin, 1 medium (925 mg).  Potatoes, boiled,  cup (255 mg).  Potatoes, mashed,  cup (330 mg).  Prune juice,  cup (370 mg).  Prunes, 5 each (305 mg).  Pudding, chocolate,  cup (230 mg).  Pumpkin, canned,  cup (250 mg).  Raisins, seedless,  cup (270 mg).  Seeds, sunflower or pumpkin, 1 oz (240 mg).  Soy milk, 1 cup (300 mg).  Spinach,  cup (420 mg).  Spinach, canned,  cup (370 mg).  Sweet potato, baked with skin, 1 medium (450 mg).  Swiss chard,  cup (480 mg).  Tomato or vegetable juice,  cup (275 mg).  Tomato sauce or puree,  cup (400-550 mg).  Tomato, raw, 1 medium (290 mg).  Tomatoes, canned,  cup (200-300 mg).  [place], 3 oz (250 mg).  Wheat germ, 1 oz (250 mg).  Winter squash,  cup (250 mg).  Yogurt, plain or fruited, 6 oz (260-435 mg).  Zucchini,  cup (220 mg). MODERATE IN POTASSIUM The following foods and beverages have 50-200 mg of potassium per serving:  Apple, 1 each (150 mg).  Apple juice,  cup (150 mg).  Applesauce,  cup (90 mg).  Apricot nectar,  cup (140 mg).  Asparagus, small spears,  cup or 6 spears (155 mg).  Bagel, cinnamon raisin, 1 each (130 mg).  Bagel,  egg or plain, 4 in., 1 each (70 mg).  Beans, green,  cup (90 mg).  Beans, yellow,  cup (190 mg).  Beer, regular, 12 oz (100 mg).  Beets, canned,  cup (125 mg).  Blackberries,  cup (115 mg).  Blueberries,  cup (60 mg).  Bread, whole wheat, 1 slice (70 mg).  Broccoli, raw,  cup (145 mg).  Cabbage,  cup (150 mg).  Carrots, cooked or raw,  cup (180 mg).  Cauliflower, raw,  cup (150 mg).  Celery, raw,  cup (155 mg).  Cereal, bran flakes, cup (120-150 mg).  Cheese, cottage,  cup (110 mg).  Cherries, 10 each (150 mg).  Chocolate, 1 oz bar (165 mg).  Coffee, brewed 6 oz (90 mg).  Corn,  cup or 1 ear (195 mg).  Cucumbers,  cup (80 mg).  Egg, large, 1 each (60 mg).  Eggplant,  cup (60 mg).  Endive, raw, cup (80 mg).  English muffin, 1 each (65 mg).  Fish, orange roughy, 3 oz (150 mg).  Frankfurter, beef or pork, 1 each (75 mg).  Fruit cocktail,  cup (115 mg).  Grape juice,  cup (170 mg).  Grapefruit,  fruit (175 mg).  Grapes,  cup (155 mg).  Greens: kale, turnip, collard,  cup (110-150 mg).  Ice cream or frozen yogurt, chocolate,  cup (175 mg).  Ice cream or frozen yogurt, vanilla,  cup (120-150 mg).  Lemons, limes, 1 each (80 mg).  Lettuce, all types, 1 cup (100 mg).  Mixed vegetables,  cup (150 mg).  Mushrooms, raw,  cup (110 mg).  Nuts: walnuts, pecans, or macadamia, 1 oz (125 mg).  Oatmeal,  cup (80 mg).  Okra,  cup (110 mg).  Onions, raw,  cup (120 mg).  Peach, 1 each (185 mg).  Peaches, canned,  cup (120 mg).  Pears, canned,  cup (120 mg).  Peas, green, frozen,  cup (90 mg).  Peppers, green,  cup (130 mg).  Peppers, red,  cup (160 mg).  Pineapple juice,  cup (165 mg).  Pineapple, fresh or canned,  cup (100 mg).  Plums, 1 each (105 mg).  Pudding, vanilla,  cup (150 mg).  Raspberries,  cup (90 mg).  Rhubarb,  cup (115 mg).  Rice, wild,  cup (80 mg).  Shrimp, 3 oz (155  mg).  Spinach, raw, 1 cup (170 mg).  Strawberries,  cup (125 mg).  Summer squash  cup (175-200 mg).  Swiss chard, raw, 1 cup (135 mg).  Tangerines, 1 each (140 mg).  Tea, brewed, 6 oz (65 mg).  Turnips,  cup (140 mg).  Watermelon,  cup (85 mg).  Wine, red, table,  5 oz (180 mg).  Wine, white, table, 5 oz (100 mg). LOW IN POTASSIUM The following foods and beverages have less than 50 mg of potassium per serving.  Bread, white, 1 slice (30 mg).  Carbonated beverages, 12 oz (less than 5 mg).  Cheese, 1 oz (20-30 mg).  Cranberries,  cup (45 mg).  Cranberry juice cocktail,  cup (20 mg).  Fats and oils, 1 Tbsp (less than 5 mg).  Hummus, 1 Tbsp (32 mg).  Nectar: papaya, mango, or pear,  cup (35 mg).  Rice, white or brown,  cup (50 mg).  Spaghetti or macaroni,  cup cooked (30 mg).  Tortilla, flour or corn, 1 each (50 mg).  Waffle, 4 in., 1 each (50 mg).  Water chestnuts,  cup (40 mg).   This information is not intended to replace advice given to you by your health care provider. Make sure you discuss any questions you have with your health care provider.   Document Released: 11/05/2004 Document Revised: 03/29/2013 Document Reviewed: 02/18/2013 Elsevier Interactive Patient Education 2016 [employer] Anemia, Nonspecific Anemia is a condition in which the concentration of red blood cells or hemoglobin in the blood is below normal. Hemoglobin is a substance in red blood cells that carries oxygen to the tissues of the body. Anemia results in not enough oxygen reaching these tissues.  CAUSES  Common causes of anemia include:   Excessive bleeding. Bleeding may be internal or external. This includes excessive bleeding from periods (in women) or from the intestine.   Poor nutrition.   Chronic kidney, thyroid, and liver disease.  Bone marrow disorders that decrease red blood cell production.  Cancer and treatments for cancer.  HIV, AIDS, and their  treatments.  Spleen problems that increase red blood cell destruction.  Blood disorders.  Excess destruction of red blood cells due to infection, medicines, and autoimmune disorders. SIGNS AND SYMPTOMS   Minor weakness.   Dizziness.   Headache.  Palpitations.   Shortness of breath, especially with exercise.   Paleness.  Cold sensitivity.  Indigestion.  Nausea.  Difficulty sleeping.  Difficulty concentrating. Symptoms may occur suddenly or they may develop slowly.  DIAGNOSIS  Additional blood tests are often needed. These help your health care provider determine the best treatment. Your health care provider will check your stool for blood and look for other causes of blood loss.  TREATMENT  Treatment varies depending on the cause of the anemia. Treatment can include:   Supplements of iron, vitamin B12, or folic acid.   Hormone medicines.   A blood transfusion. This may be needed if blood loss is severe.   Hospitalization. This may be needed if there is significant continual blood loss.   Dietary changes.  Spleen removal. HOME CARE INSTRUCTIONS Keep all follow-up appointments. It often takes many weeks to correct anemia, and having your health care provider check on your condition and your response to treatment is very important. SEEK IMMEDIATE MEDICAL CARE IF:   You develop extreme weakness, shortness of breath, or chest pain.   You become dizzy or have trouble concentrating.  You develop heavy vaginal bleeding.   You develop a rash.   You have bloody or black, tarry stools.   You faint.   You vomit up blood.   You vomit repeatedly.   You have abdominal pain.  You have a fever or persistent symptoms for more than 2-3 days.   You have a fever and your symptoms suddenly get worse.   You are  dehydrated.  MAKE SURE YOU:  Understand these instructions.  Will watch your condition.  Will get help right away if you are not doing  well or get worse.   This information is not intended to replace advice given to you by your health care provider. Make sure you discuss any questions you have with your health care provider.   Document Released: 05/01/2004 Document Revised: 11/24/2012 Document Reviewed: 09/17/2012 Elsevier Interactive Patient Education 2016 [employer]. Hypokalemia Hypokalemia means that the amount of potassium in the blood is lower than normal.Potassium is a chemical, called an electrolyte, that helps regulate the amount of fluid in the body. It also stimulates muscle contraction and helps nerves function properly.Most of the body's potassium is inside of cells, and only a very small amount is in the blood. Because the amount in the blood is so small, minor changes can be life-threatening. CAUSES  Antibiotics.  Diarrhea or vomiting.  Using laxatives too much, which can cause diarrhea.  Chronic kidney disease.  Water pills (diuretics).  Eating disorders (bulimia).  Low magnesium level.  Sweating a lot. SIGNS AND SYMPTOMS  Weakness.  Constipation.  Fatigue.  Muscle cramps.  Mental confusion.  Skipped heartbeats or irregular heartbeat (palpitations).  Tingling or numbness. DIAGNOSIS  Your health care provider can diagnose hypokalemia with blood tests. In addition to checking your potassium level, your health care provider may also check other lab tests. TREATMENT Hypokalemia can be treated with potassium supplements taken by mouth or adjustments in your current medicines. If your potassium level is very low, you may need to get potassium through a vein (IV) and be monitored in the hospital. A diet high in potassium is also helpful. Foods high in potassium are:  Nuts, such as peanuts and pistachios.  Seeds, such as sunflower seeds and pumpkin seeds.  Peas, lentils, and lima beans.  Whole grain and bran cereals and breads.  Fresh fruit and vegetables, such as apricots, avocado,  bananas, cantaloupe, kiwi, oranges, tomatoes, asparagus, and potatoes.  Orange and tomato juices.  Red meats.  Fruit yogurt. HOME CARE INSTRUCTIONS  Take all medicines as prescribed by your health care provider.  Maintain a healthy diet by including nutritious food, such as fruits, vegetables, nuts, whole grains, and lean meats.  If you are taking a laxative, be sure to follow the directions on the label. SEEK MEDICAL CARE IF:  Your weakness gets worse.  You feel your heart pounding or racing.  You are vomiting or having diarrhea.  You are diabetic and having trouble keeping your blood glucose in the normal range. SEEK IMMEDIATE MEDICAL CARE IF:  You have chest pain, shortness of breath, or dizziness.  You are vomiting or having diarrhea for more than 2 days.  You faint. MAKE SURE YOU:   Understand these instructions.  Will watch your condition.  Will get help right away if you are not doing well or get worse.   This information is not intended to replace advice given to you by your health care provider. Make sure you discuss any questions you have with your health care provider.   Document Released: 03/24/2005 Document Revised: 04/14/2014 Document Reviewed: 09/24/2012 Elsevier Interactive Patient Education [employer]

## 2015-09-21 NOTE — Telephone Encounter (Signed)
Patient has been having significant diarrhea still ongoing last night, labs checked yesterday with K+ 2.6. I suspect GI loss as source. I asked him to [facility] in order to get IV Magnesium and oral potassium repletion.

## 2015-09-21 NOTE — Telephone Encounter (Signed)
Patient called after discharge from ED for advise regarding being informed that his potassium was low. Advised patient that he was given an Rx for potassium and to take it as directed and if he feels poorly over the weekend to return to the ED.

## 2015-09-25 ENCOUNTER — Telehealth: Payer: Self-pay | Admitting: *Deleted

## 2015-09-25 LAB — HIV-1 INTEGRASE GENOTYPE

## 2015-09-25 NOTE — Telephone Encounter (Signed)
Spoke to patient and he will be taking potassium supplements x 15 days. Lucas Wood

## 2015-09-25 NOTE — Telephone Encounter (Signed)
Dr. Ninetta LightsHatcher completed patient's [name] form and I faxed it to Gae DryJackie Moore, HR rep for [facility] [employer] [facility] # [phone]. Phone [phone], ext [phone]. Confirmation received Wendall MolaJacqueline Cockerham

## 2015-09-25 NOTE — Telephone Encounter (Signed)
-----   Message from Ginnie SmartJeffrey C Hatcher, MD sent at 09/25/2015  1:52 PM EDT ----- Did pt get supplemental potassium?  ----- Message -----    From: Lab in [facility] Interface    Sent: 09/18/2015   9:38 PM      To: Ginnie SmartJeffrey C Hatcher, MD

## 2015-09-27 LAB — HLA B*5701: HLA-B 5701 W/RFLX HLA-B HIGH: NEGATIVE

## 2015-10-01 ENCOUNTER — Telehealth: Payer: Self-pay | Admitting: *Deleted

## 2015-10-01 NOTE — Telephone Encounter (Signed)
Patient's [name] has one error regarding the start and end date of his time out of work. He is going to have human resources dept to refax it and because Dr. Ninetta LightsHatcher is out, I will ask if another MD can correct this one page and then I can refax it. Lucas MolaJacqueline Ryken Paschal

## 2015-10-02 ENCOUNTER — Telehealth: Payer: Self-pay | Admitting: *Deleted

## 2015-10-02 NOTE — Telephone Encounter (Signed)
Patient called back for update on status of [name] and wanted to be written out of work through 11/05/15. Explained that Dr. Ninetta LightsHatcher signed for intermittent [name] to cover him for lab and MD appts every 3-4 months. He will call his work and advise them that he can return to work. Wendall MolaJacqueline Cockerham

## 2015-10-04 LAB — HIV-1 GENOTYPR PLUS

## 2015-10-22 ENCOUNTER — Encounter: Payer: Self-pay | Admitting: Infectious Diseases

## 2015-10-22 ENCOUNTER — Ambulatory Visit (INDEPENDENT_AMBULATORY_CARE_PROVIDER_SITE_OTHER): Payer: Self-pay | Admitting: Infectious Diseases

## 2015-10-22 VITALS — BP 111/78 | HR 76 | Temp 97.6°F | Ht 71.0 in | Wt 114.0 lb

## 2015-10-22 DIAGNOSIS — B3781 Candidal esophagitis: Secondary | ICD-10-CM

## 2015-10-22 DIAGNOSIS — B370 Candidal stomatitis: Secondary | ICD-10-CM

## 2015-10-22 DIAGNOSIS — B20 Human immunodeficiency virus [HIV] disease: Secondary | ICD-10-CM

## 2015-10-22 MED ORDER — FLUCONAZOLE 100 MG PO TABS: 100.0000 mg | ORAL_TABLET | Freq: Every day | ORAL | Status: DC

## 2015-10-22 MED ORDER — DARUNAVIR ETHANOLATE 800 MG PO TABS
800.0000 mg | ORAL_TABLET | Freq: Every day | ORAL | Status: DC
Start: 2015-10-22 — End: 2015-10-23

## 2015-10-22 MED ORDER — ELVITEG-COBIC-EMTRICIT-TENOFAF 150-150-200-10 MG PO TABS: 1.0000 | ORAL_TABLET | Freq: Every day | ORAL | Status: DC

## 2015-10-22 NOTE — Assessment & Plan Note (Signed)
Will start him on fluconazole

## 2015-10-22 NOTE — Addendum Note (Signed)
Addended by: Jaidence Geisler C on: 10/22/2015 10:26 AM   Modules accepted: Orders, Level of Service

## 2015-10-22 NOTE — Assessment & Plan Note (Addendum)
Patient reports feeling well with exception of headaches, which are resolved using OTC Ibuprofen. Patient tolerating Azithro/Bactrim well. Prescribe:  DRV/Genvoya. Will have patient meet with pharmacy, he'd like a pill box as well. RTC in 2 months with labs prior. Condoms offered/refused.

## 2015-10-22 NOTE — Progress Notes (Signed)
HPI: Lucas Wood is a 45 y.o. male who presents today to see Dr. Ninetta LightsHatcher for his HIV infection.  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  Dr. Ninetta LightsHatcher is [facility] Prez[facility].  Allergies: No Known Allergies  Vitals: Temp: 97.6 F (36.4 C) (07/17 0856) Temp Source: Oral (07/17 0856) BP: 111/78 mmHg (07/17 0856) Pulse Rate: 76 (07/17 0856)  Past Medical History: Past Medical History  Diagnosis Date  . HIV (human immunodeficiency virus infection) (HCC) 2010    Social History: Social History   Social History  . Marital Status: Single    Spouse Name: N/A  . Number of Children: N/A  . Years of Education: N/A   Social History Main Topics  . Smoking status: Current Every Day Smoker -- 1.00 packs/day    Types: Cigarettes  . Smokeless tobacco: Never Used  . Alcohol Use: No  . Drug Use: No  . Sexual Activity: No   Other Topics Concern  . None   Social History Narrative    Labs: HIV 1 RNA QUANT (copies/mL)  Date Value  09/18/2015 [id number]*  06/27/2015 3870000   CD4 T CELL ABS (/uL)  Date Value  09/18/2015 30*  07/16/2015 50*  06/27/2015 110*   HEP B S AB (no units)  Date Value  09/18/2015 NEG   HEPATITIS B SURFACE AG (no units)  Date Value  09/18/2015 NEGATIVE   HCV AB (no units)  Date Value  09/18/2015 NEGATIVE    CrCl: CrCl cannot be calculated (Patient has no serum creatinine result on file.).  Lipids:    Component Value Date/Time   CHOL 113* 09/18/2015 1416   TRIG 214* 09/18/2015 1416   HDL 28* 09/18/2015 1416   CHOLHDL 4.0 09/18/2015 1416   VLDL 43* 09/18/2015 1416   LDLCALC 42 09/18/2015 1416    Assessment: I spoke to Lucas Wood about his new HIV regimen.  I instructed him to take the Genvoya and Prezista together with food.  He states his more consistent meal during the day is dinner so I told him taking both medications with dinner everyday would be fine.  He is also on weekly azithromycin, three times weekly  Bactrim, and will be started on fluconazole - all for OI prophylaxis (CD4 30).  He will come back to see Dr. Ninetta LightsHatcher on October 11 (first available appointment).  I will bring him back to see pharmacy on September 19th to follow up and to get labs before his next appointment with Dr. Ninetta LightsHatcher.  Plans: - Genvoya PO once daily with food - Prezista 800 mg PO once daily with food - Bactrim 1 SS tablet PO three times weekly - Azithromycin 1200 mg PO once weekly - Fluconazole 100 mg PO daily - F/u appointment with labs and pharmacy on 9/19 at 9am - F/u appointment with Dr. Ninetta LightsHatcher 10/11 at 9:30am  Lakyra Tippins L. Hinton DyerStewart, BS, PharmD Infectious Diseases Clinical Pharmacist [facility] 10/22/2015, 10:16 AM

## 2015-10-22 NOTE — Progress Notes (Signed)
   Subjective:    Patient ID: Lucas Wood, male    DOB: 1970/10/11, 45 y.o.   MRN: [id number]  HPI [name] is a 45 year old male diagnosed with HIV in 2010.  He was last seen in clinic on 09/18/15.  He reported having only been taking his TRV.  Therefore, TRV was discontinued, Azithro/Bactrim prescribed, and genotype collected.  Results reveal a M184V mutation.  Patient continues on his OI prophylaxis and denies side effects from medications.  He presents to clinic for routine follow-up with complaints of headaches.  He denies photosensitivity, nausea, or changes in vision (blurrieness, double vision, or spots).  Use of over-the-counter Ibuprofen provides relief.  Lab Results  Component Value Date   CD4TABS 30* 09/18/2015   CD4TABS 50* 07/16/2015   CD4TABS 110* 06/27/2015   Lab Results  Component Value Date   HIV1RNAQUANT [id number]* 09/18/2015   Review of Systems  Constitutional: Negative for fever, chills and appetite change.  HENT: Positive for trouble swallowing. Negative for sore throat.   Eyes: Negative for photophobia and visual disturbance.  Respiratory: Negative for shortness of breath.   Cardiovascular: Negative for chest pain.  Gastrointestinal: Negative for abdominal pain, diarrhea and constipation.  Genitourinary: Negative for dysuria and discharge.  Skin: Negative for rash.  Neurological: Positive for headaches. Negative for light-headedness.      Objective:   Physical Exam  Constitutional: He is oriented to person, place, and time. He appears well-developed and well-nourished.  HENT:  Mouth/Throat: Abnormal dentition. Oropharyngeal exudate present.    Eyes: Conjunctivae are normal. Pupils are equal, round, and reactive to light.  Cardiovascular: Normal rate, regular rhythm and normal heart sounds.   No murmur heard. Pulmonary/Chest: Effort normal and breath sounds normal. No respiratory distress. He has no wheezes.  Abdominal: Soft. Bowel sounds  are normal. He exhibits no distension. There is no tenderness.  Lymphadenopathy:    He has no cervical adenopathy.  Neurological: He is alert and oriented to person, place, and time.  Skin: Skin is warm and dry.      Assessment & Plan:

## 2015-10-23 ENCOUNTER — Telehealth: Payer: Self-pay

## 2015-10-23 ENCOUNTER — Other Ambulatory Visit: Payer: Self-pay

## 2015-10-23 DIAGNOSIS — B370 Candidal stomatitis: Secondary | ICD-10-CM

## 2015-10-23 DIAGNOSIS — B20 Human immunodeficiency virus [HIV] disease: Secondary | ICD-10-CM

## 2015-10-23 MED ORDER — FLUCONAZOLE 100 MG PO TABS: 100.0000 mg | ORAL_TABLET | Freq: Every day | ORAL | Status: DC

## 2015-10-23 MED ORDER — DARUNAVIR ETHANOLATE 800 MG PO TABS: 800.0000 mg | ORAL_TABLET | Freq: Every day | ORAL | Status: AC

## 2015-10-23 MED ORDER — DARUNAVIR ETHANOLATE 800 MG PO TABS: 800.0000 mg | ORAL_TABLET | Freq: Every day | ORAL | Status: DC

## 2015-10-23 MED ORDER — ELVITEG-COBIC-EMTRICIT-TENOFAF 150-150-200-10 MG PO TABS
1.0000 | ORAL_TABLET | Freq: Every day | ORAL | Status: AC
Start: 2015-10-23 — End: ?

## 2015-10-23 MED ORDER — SULFAMETHOXAZOLE-TRIMETHOPRIM 400-80 MG PO TABS: 1.0000 | ORAL_TABLET | ORAL | Status: DC

## 2015-10-23 MED ORDER — FLUCONAZOLE 100 MG PO TABS: 100.0000 mg | ORAL_TABLET | Freq: Every day | ORAL | Status: AC

## 2015-10-23 MED ORDER — SULFAMETHOXAZOLE-TRIMETHOPRIM 400-80 MG PO TABS: 1.0000 | ORAL_TABLET | ORAL | Status: AC

## 2015-10-23 MED ORDER — ELVITEG-COBIC-EMTRICIT-TENOFAF 150-150-200-10 MG PO TABS: 1.0000 | ORAL_TABLET | Freq: Every day | ORAL | Status: DC

## 2015-10-23 NOTE — Telephone Encounter (Signed)
Patient called stating he just found out he lost his job and did not have a way to get medication. Went to [facility] to try and pharmacy stated insurance had been terminated. Notified financial counselor, Marcelino DusterMichelle, of patient situation. [facility] started. Returned to phone and notified patient of medication assistance being started. Patient stated telephone number in record is not in service at this time, and he has no alternative phone number. Patient wants medication sent to clinic. Instructed patient to call back tomorrow morning(07/19) to make sure clinic does not any other information needed. Patient verbalized understanding.   Rejeana Brockandace Murray, LPN

## 2015-11-06 ENCOUNTER — Encounter: Payer: Self-pay | Admitting: *Deleted

## 2015-11-07 ENCOUNTER — Encounter: Payer: Self-pay | Admitting: Infectious Diseases

## 2015-11-26 ENCOUNTER — Telehealth: Payer: Self-pay | Admitting: *Deleted

## 2015-11-26 NOTE — Telephone Encounter (Signed)
Received signed [facility]. [facility] notified. Andree CossHowell, Michelle M, RN

## 2015-12-25 ENCOUNTER — Ambulatory Visit: Payer: Managed Care, Other (non HMO)

## 2015-12-25 ENCOUNTER — Other Ambulatory Visit: Payer: Managed Care, Other (non HMO)

## 2016-01-16 ENCOUNTER — Ambulatory Visit: Payer: Managed Care, Other (non HMO) | Admitting: Infectious Diseases

## 2017-05-29 IMAGING — CR DG CHEST 2V
2 series · 2 of 2 positions shown · non-contrast
Comparison: 07/16/2015

CLINICAL DATA: Shortness of breath for 2 days

EXAM:
CHEST  2 VIEW

[w chest pa]
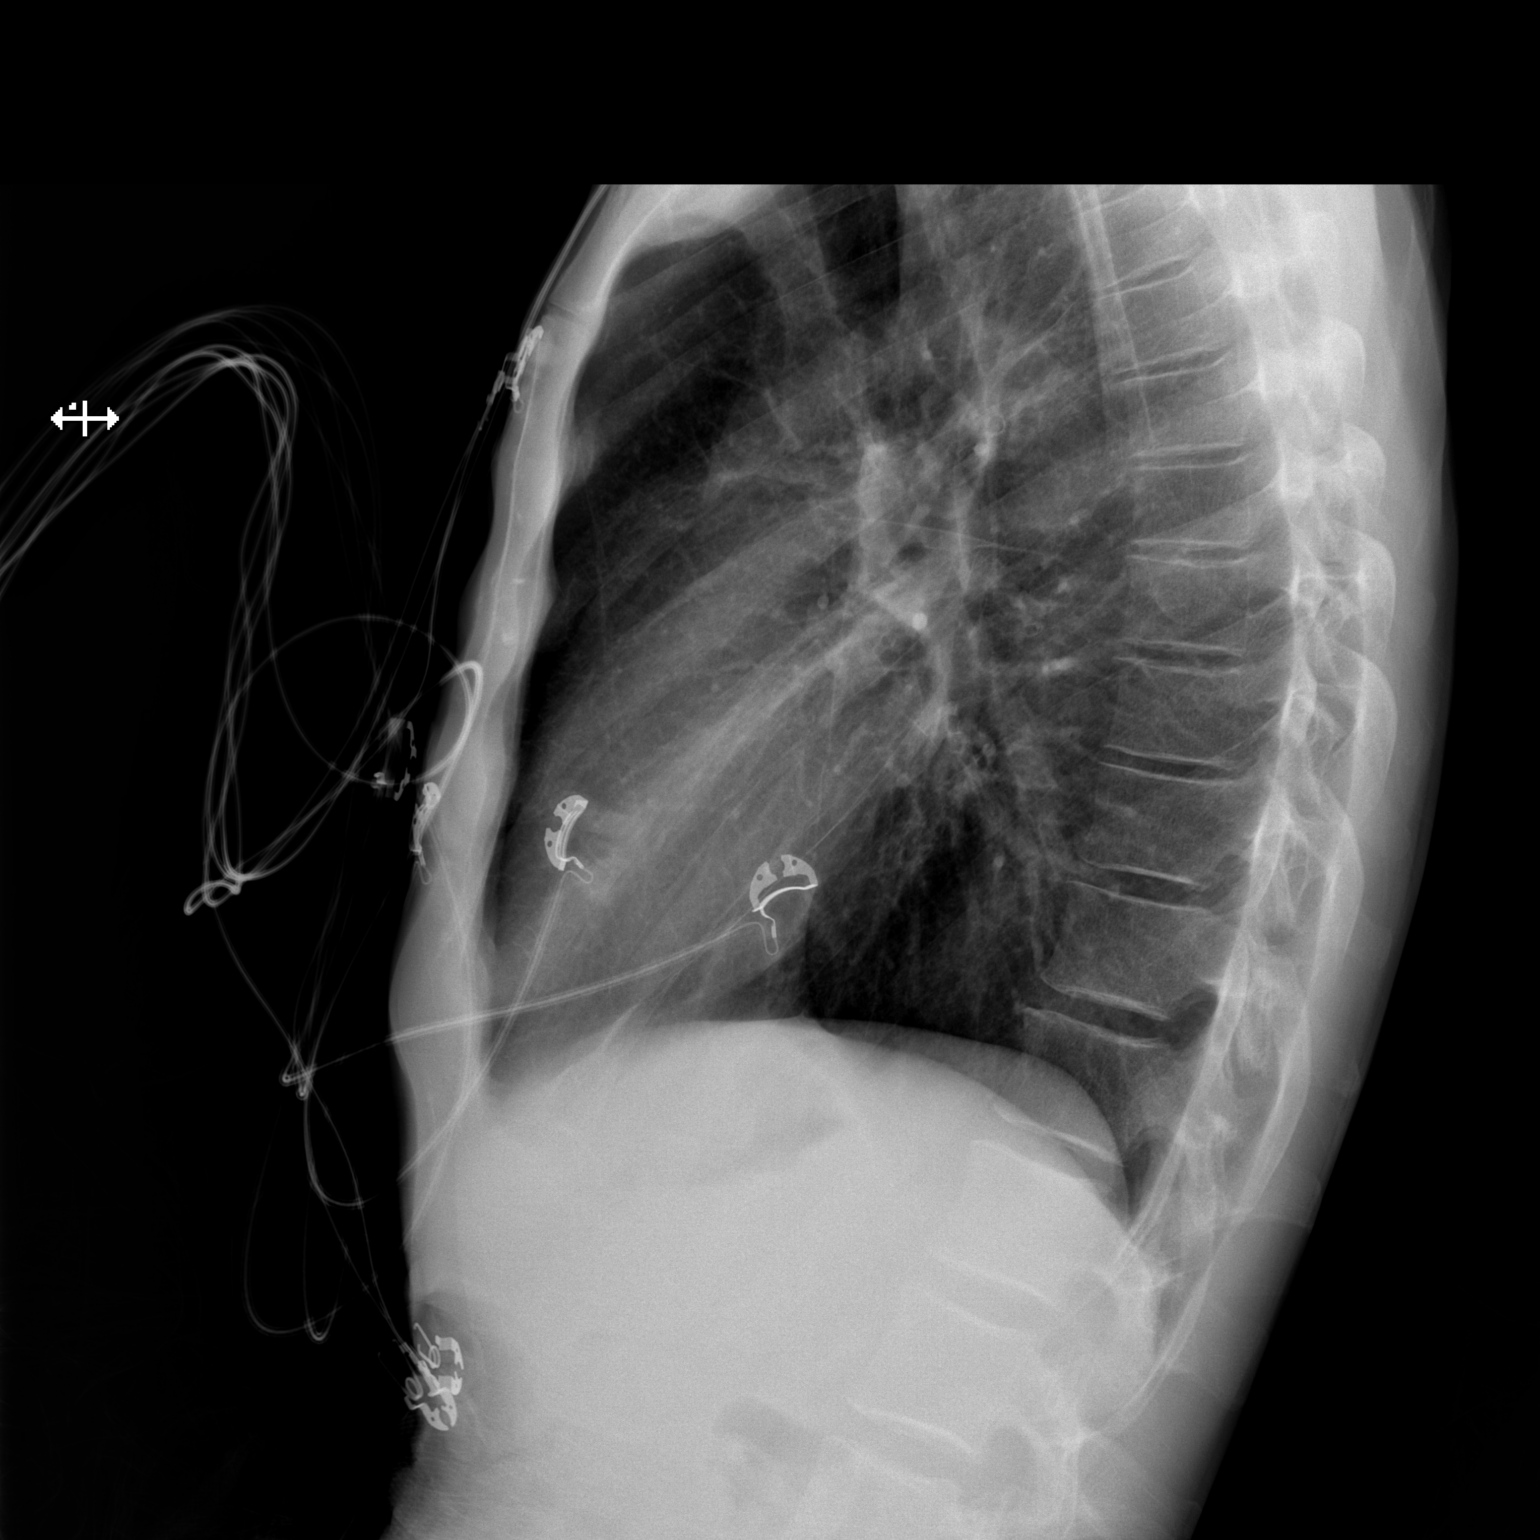

[x chest ap]
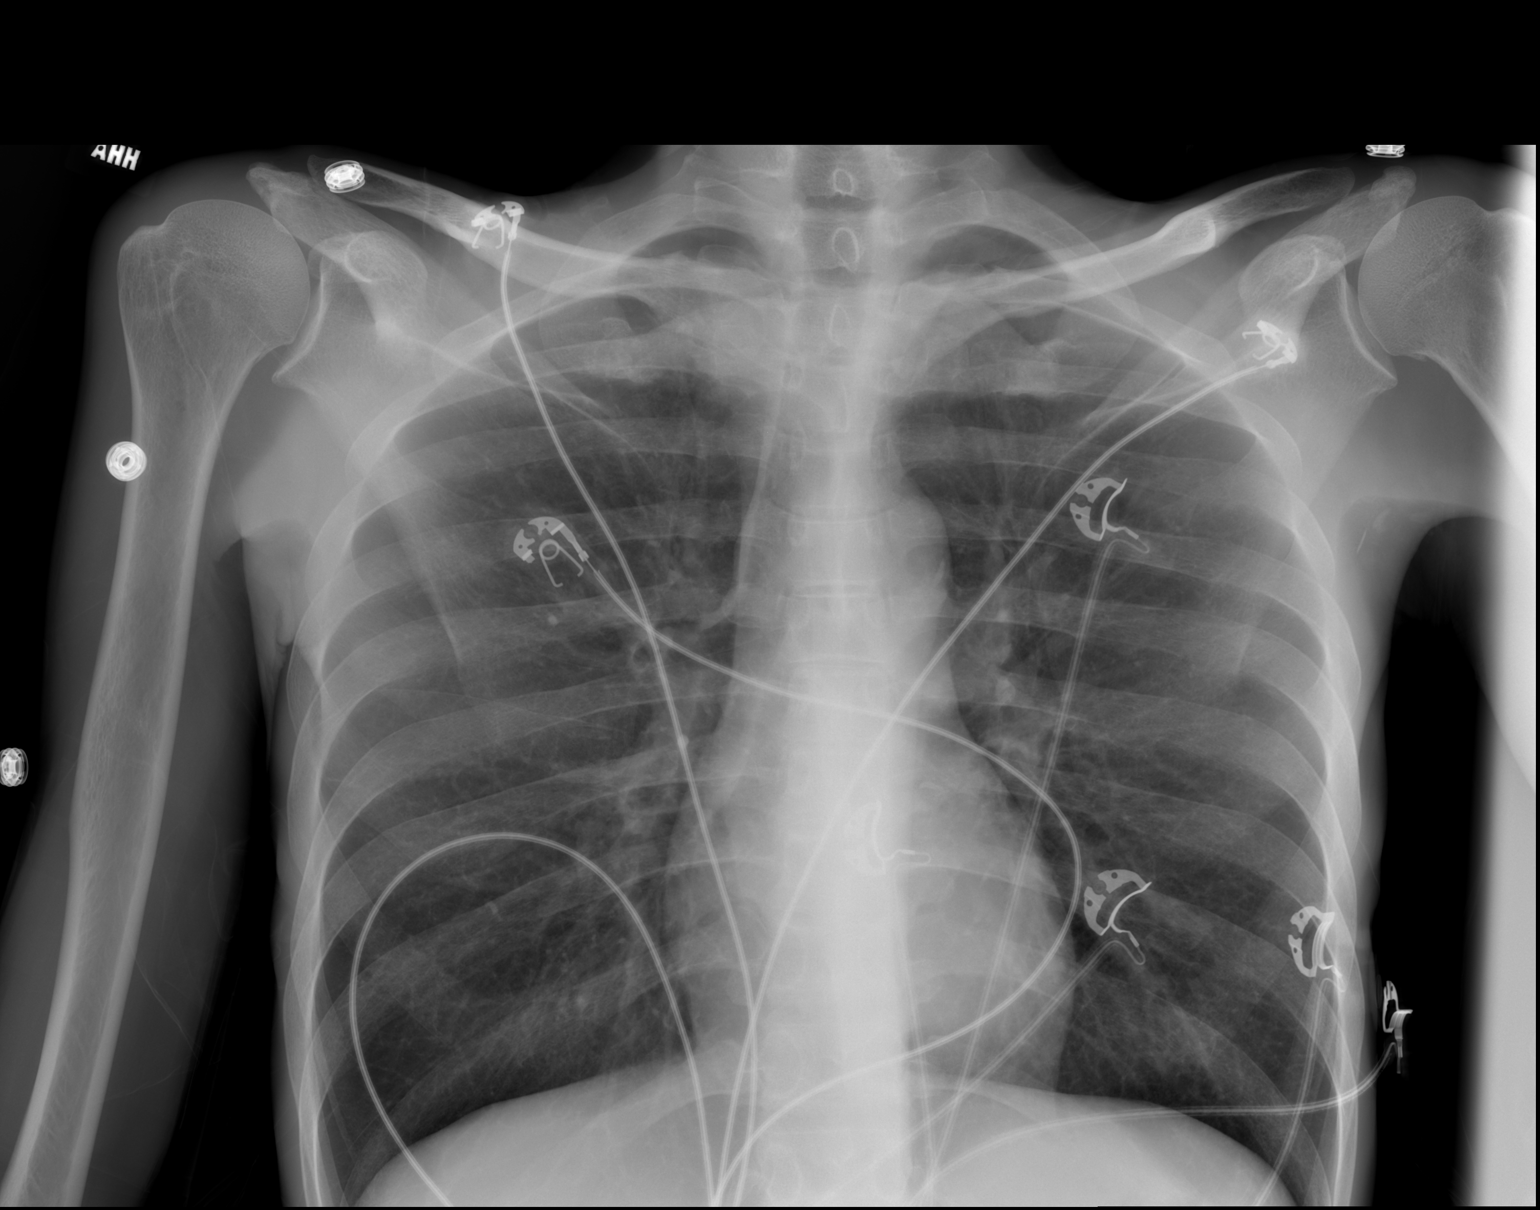

[2 of 2 positions shown; findings below may reference images not displayed]

FINDINGS: Cardiomediastinal silhouette is stable. No acute infiltrate or
pleural effusion. No pulmonary edema. Hyperinflation again noted.
Bony thorax is unremarkable.
IMPRESSION: No active cardiopulmonary disease.  Hyperinflation again noted.
